# Patient Record
Sex: Female | Born: 1935 | Race: White | Hispanic: No
Health system: Southern US, Community
[De-identification: ages and names within clinical notes are randomized; demographics above are authoritative.]

## PROBLEM LIST (undated history)

## (undated) DIAGNOSIS — I10 Essential (primary) hypertension: Secondary | ICD-10-CM

## (undated) DIAGNOSIS — I2510 Atherosclerotic heart disease of native coronary artery without angina pectoris: Secondary | ICD-10-CM

## (undated) HISTORY — PX: ABDOMINAL HYSTERECTOMY: SHX81

---

## 2006-06-24 ENCOUNTER — Ambulatory Visit: Admission: RE | Admit: 2006-06-24 | Discharge: 2006-06-24 | Payer: Self-pay | Admitting: Gynecologic Oncology

## 2006-07-29 ENCOUNTER — Encounter (INDEPENDENT_AMBULATORY_CARE_PROVIDER_SITE_OTHER): Payer: Self-pay | Admitting: *Deleted

## 2006-07-29 ENCOUNTER — Inpatient Hospital Stay (HOSPITAL_COMMUNITY): Admission: RE | Admit: 2006-07-29 | Discharge: 2006-08-03 | Payer: Self-pay | Admitting: Gynecologic Oncology

## 2006-08-11 ENCOUNTER — Ambulatory Visit: Admission: RE | Admit: 2006-08-11 | Discharge: 2006-08-11 | Payer: Self-pay | Admitting: Gynecologic Oncology

## 2007-04-13 ENCOUNTER — Ambulatory Visit: Admission: RE | Admit: 2007-04-13 | Discharge: 2007-04-13 | Payer: Self-pay | Admitting: Gynecologic Oncology

## 2007-04-13 ENCOUNTER — Encounter: Payer: Self-pay | Admitting: Gynecologic Oncology

## 2007-04-13 ENCOUNTER — Other Ambulatory Visit: Admission: RE | Admit: 2007-04-13 | Discharge: 2007-04-13 | Payer: Self-pay | Admitting: Gynecologic Oncology

## 2007-12-22 ENCOUNTER — Ambulatory Visit: Admission: RE | Admit: 2007-12-22 | Discharge: 2007-12-22 | Payer: Self-pay | Admitting: Gynecologic Oncology

## 2007-12-22 ENCOUNTER — Encounter: Payer: Self-pay | Admitting: Gynecologic Oncology

## 2007-12-22 ENCOUNTER — Other Ambulatory Visit: Admission: RE | Admit: 2007-12-22 | Discharge: 2007-12-22 | Payer: Self-pay | Admitting: Gynecologic Oncology

## 2008-08-22 ENCOUNTER — Ambulatory Visit: Admission: RE | Admit: 2008-08-22 | Discharge: 2008-08-22 | Payer: Self-pay | Admitting: Gynecologic Oncology

## 2008-08-22 ENCOUNTER — Other Ambulatory Visit: Admission: RE | Admit: 2008-08-22 | Discharge: 2008-08-22 | Payer: Self-pay | Admitting: Gynecologic Oncology

## 2008-08-22 ENCOUNTER — Encounter: Payer: Self-pay | Admitting: Gynecologic Oncology

## 2009-03-07 ENCOUNTER — Ambulatory Visit: Admission: RE | Admit: 2009-03-07 | Discharge: 2009-03-07 | Payer: Self-pay | Admitting: Gynecologic Oncology

## 2009-03-07 ENCOUNTER — Other Ambulatory Visit: Admission: RE | Admit: 2009-03-07 | Discharge: 2009-03-07 | Payer: Self-pay | Admitting: Gynecologic Oncology

## 2009-03-07 ENCOUNTER — Encounter: Payer: Self-pay | Admitting: Gynecologic Oncology

## 2009-07-25 ENCOUNTER — Encounter: Payer: Self-pay | Admitting: Gynecologic Oncology

## 2009-07-25 ENCOUNTER — Ambulatory Visit: Admission: RE | Admit: 2009-07-25 | Discharge: 2009-07-25 | Payer: Self-pay | Admitting: Gynecologic Oncology

## 2009-07-25 ENCOUNTER — Other Ambulatory Visit: Admission: RE | Admit: 2009-07-25 | Discharge: 2009-07-25 | Payer: Self-pay | Admitting: Gynecologic Oncology

## 2010-02-06 ENCOUNTER — Ambulatory Visit: Admission: RE | Admit: 2010-02-06 | Discharge: 2010-02-06 | Payer: Self-pay | Admitting: Gynecologic Oncology

## 2010-02-06 ENCOUNTER — Other Ambulatory Visit: Admission: RE | Admit: 2010-02-06 | Discharge: 2010-02-06 | Payer: Self-pay | Admitting: Gynecologic Oncology

## 2010-07-24 ENCOUNTER — Other Ambulatory Visit: Admission: RE | Admit: 2010-07-24 | Discharge: 2010-07-24 | Payer: Self-pay | Admitting: Gynecologic Oncology

## 2010-07-24 ENCOUNTER — Ambulatory Visit: Admission: RE | Admit: 2010-07-24 | Discharge: 2010-07-24 | Payer: Self-pay | Admitting: Gynecologic Oncology

## 2011-01-15 ENCOUNTER — Ambulatory Visit: Payer: Medicare Other | Attending: Gynecologic Oncology | Admitting: Gynecologic Oncology

## 2011-01-15 DIAGNOSIS — Z90710 Acquired absence of both cervix and uterus: Secondary | ICD-10-CM | POA: Insufficient documentation

## 2011-01-15 DIAGNOSIS — Z9079 Acquired absence of other genital organ(s): Secondary | ICD-10-CM | POA: Insufficient documentation

## 2011-01-15 DIAGNOSIS — C549 Malignant neoplasm of corpus uteri, unspecified: Secondary | ICD-10-CM | POA: Insufficient documentation

## 2011-01-31 NOTE — Consult Note (Signed)
Evelyn Patterson, Evelyn Patterson                 ACCOUNT NO.:  [id number]  MEDICAL RECORD NO.:  [id number]           PATIENT TYPE:  LOCATION:                                 FACILITY:  PHYSICIAN:  Haedyn Breau A. Duard Brady, MD    DATE OF BIRTH:  1929-01-01  DATE OF CONSULTATION:  01/15/2011 DATE OF DISCHARGE:                                CONSULTATION   HISTORY:  [name] is a very pleasant 77 year old, soon to be 77 year old with a stage IB grade 1 endometrial carcinoma who underwent TAH-BSO, bilateral pelvic lymph node sampling in September 2007.  Final pathology revealed a grade 1 lesion with 20% myometrial invasion, negative lymph nodes, negative adnexa, and no lymphovascular space involvement.  She was dispositioned to close followup and then was last seen by me in September of last year, at which time her exam and Pap smear were negative.  She comes in today for followup.  REVIEW OF SYSTEMS:  She denies any significant complaints.  She denies any nausea, vomiting, fever, chills, headaches, visual changes.  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 She is out of her diclofenac, which causes her to have some discomfort. She states that when she is not on her diclofenac, she has a lot of arthritic-type pains and finds it difficult to get up and around, but when she takes it she feels quite well and she needs a prescription for that.  She has not had a recurrent UTIs.  She denies any unintentional weight loss, weight gain.  She enjoyed 2 weeks in [place] with her son over the Thanksgiving holiday and is very much looking forward to going back next year.  MEDICATION LIST:  Reviewed and is unchanged.  HEALTH MAINTENANCE:  She is up to date on her mammogram, she had one in December.  PHYSICAL EXAMINATION:  VITAL SIGNS:  Height 4 feet 11 inches, weight 145 pounds, blood pressure 128/80, pulse 66, temperature 97.3, respirations 20. GENERAL:  A well-nourished,  well-developed female, in no acute distress. NECK:  Supple.  There is no lymphadenopathy, no thyromegaly. LUNGS:  Clear to auscultation bilaterally. CARDIOVASCULAR:  Regular rate and rhythm. ABDOMEN:  A well-healed vertical midline incision.  Abdomen is soft, nontender, nondistended with no palpable masses or hepatosplenomegaly. Groins are negative for adenopathy. EXTREMITIES:  No edema. PELVIC:  External genitalia is within normal limits, though atrophic. The vagina is markedly atrophic.  The vaginal cuff is visualized.  There are no visible lesions.  Bimanual examination reveals no masses or nodularity. RECTAL: Confirms.  ASSESSMENT:  A 77 year old, soon to be 77 year old with an early stage endometrial carcinoma who clinically has no evidence of recurrent disease.  PLAN: 1. She will return to see [place] in 6 months and that will be her 5-year     anniversary.  We will perform a Pap smear at that time.  As it will     be 5 years at that time, we will find her a practice closer to home     for her routine     gynecologic care.  Her first  referring OB-GYN is no longer     practice. 2. She will follow up with Dr. Desmond Dike, her new primary care     physician. 3. I refill her diclofenac.     Evelyn Patterson A. Duard Brady, MD     PAG/MEDQ  D:  01/15/2011  T:  01/16/2011  Job:  [id number]  cc:   Telford Nab, R.N. [address] [place], [place] [address]  Dr. Desmond Dike  Electronically Signed by Cleda Mccreedy MD on 01/16/2011 12:11:30 PM

## 2011-03-25 NOTE — Consult Note (Signed)
NAME:  TAIANA, TEMKIN                 ACCOUNT NO.:  [id number]   MEDICAL RECORD NO.:  [id number]          PATIENT TYPE:  OUT   LOCATION:  GYN                          FACILITY:  [facility]   PHYSICIAN:  Paola A. Duard Brady, MD    DATE OF BIRTH:  [birth year]   DATE OF CONSULTATION:  12/22/2007  DATE OF DISCHARGE:                                 CONSULTATION   Ms. Ream is a 77 year old with a history of a stage IB grade 1  endometrial carcinoma who underwent TAH-BSO, bilateral pelvic lymph node  dissection September 2007.  Operative findings included a normal-  appearing uterus with normal atrophic adnexa.  Frozen section revealed a  grade 1 endometrioid adenocarcinoma with 20% myometrial invasion,  negative lymph nodes, negative washings and negative adnexa.  I last saw  her in June 2008.  At that time, her exam was unremarkable.  Pap smear  was negative.  She states that she was seen by Dr. Oliva Bustard in  October.  I do not have records of this and per her recollection her  exam was also negative.  She comes in today for follow-up.   REVIEW OF SYSTEMS:  She denies any chest pain, shortness of breath,  nausea, vomiting, fevers, chills, headaches or visual change.  She  denies any significant change in bowel or bladder habits, any vaginal  bleeding, chest pain, shortness of breath, unintentional weight loss or  weight gain. Her arthritis pain is much better, her arthritis is  primarily in her right shoulder and left hip.  She was started on  arthritis medications by her primary physician which she takes twice a  day.  She does not recall the name of it but with that her pain is  significantly improved.  She exercises twice daily twice a day and raked  her leaves yesterday.   MEDICATIONS:  Arthritis medicine, questionable name, simvastatin,  triamterene, hydrochlorothiazide, multivitamin, vitamin C, calcium.   ALLERGIES:  None.   HEALTH MAINTENANCE:  She is up-to-date on her mammograms.   PHYSICAL EXAMINATION:  Weight 145 pounds which is up 13 pounds since her  last visit, blood pressure 128/78, well-nourished, well-developed female  in no acute distress.  NECK:  Supple, there is no lymphadenopathy, no thyromegaly.  LUNGS:  Clear to auscultation bilaterally.  CARDIOVASCULAR:  Regular rate and rhythm.  ABDOMEN:  She has a well-healed vertical skin incision. There is no  evidence of about incisional hernias.  The abdomen is soft, nontender,  nondistended.  There are no palpable masses or hepatosplenomegaly.  Groins are negative for adenopathy.  EXTREMITIES:  There is no edema.  PELVIC:  External genitalia is within normal limits though atrophic. The  vagina is markedly atrophic.  The vaginal cuff is visualized.  There are  no visible lesions.  A ThinPrep Pap was submitted without difficulty.  Bimanual examination reveals no masses or nodularity.  Rectal confirms.   ASSESSMENT:  A 77 year old with a stage IB grade 1 endometrioid  adenocarcinoma who has no evidence of recurrent disease.  She is out  approximately a year and half from  her surgery.   PLAN:  Will followup on the results of her Pap smear from today.  She  will see Dr. Thamas Jaegers in 4 months and return to see [place] in 8 months.      Paola A. Duard Brady, MD  Electronically Signed     PAG/MEDQ  D:  12/22/2007  T:  12/23/2007  Job:  [id number]   cc:   Oliva Bustard  Fax: [phone]   Telford Nab, R.N.  [address]  [place], [place] [address]   Wyonia Hough, MD   Roseanna Rainbow, M.D.  Fax: [phone]

## 2011-03-25 NOTE — Consult Note (Signed)
NAME:  Evelyn Patterson, Evelyn Patterson                 ACCOUNT NO.:  [id number]   MEDICAL RECORD NO.:  [id number]          PATIENT TYPE:  OUT   LOCATION:  GYN                          FACILITY:  [facility]   PHYSICIAN:  Paola A. Duard Brady, MD    DATE OF BIRTH:  October 13, 1929   DATE OF CONSULTATION:  04/13/2007  DATE OF DISCHARGE:                                 CONSULTATION   Ms. Haan is a 77 year old with a history of a stage IB grade I  endometrial carcinoma who underwent TAH-BSO, partial pelvic lymph node  dissection in September 2007.  Operative findings included a normal-  appearing uterus with normal atrophic adnexa.  We sent the uterus for  frozen section and while frozen section was pending, bilateral pelvic  lymph node sampling was obtained.  The tumor invaded 20% of the  myometrium with no lymphovascular space involvement, negative washings,  negative adnexa and negative lymph nodes.  We last saw her in October  2007.  She was seen by Dr. Oliva Bustard in the interim in January 2008,  but no Pap smear was performed at that time.  She comes in today for  follow-up.  She is overall doing quite well.  She is very happy with how  she has been feeling.   REVIEW OF SYSTEMS:  She denies any chest pain, shortness of breath,  nausea, vomiting, fevers, chills, headaches or visual changes.  She has  lost a fair amount of weight but that has been intentional.  She has  been out in her yard quite a bit.  She has also been to see her primary  care physician and they gave her some medication for arthritis pains  which has improved her quality of life significantly.  She does not  recall the name of the medication she has taken.  She was seen by her  primary physician last week and had blood work done and states that  everything was excellent.  She otherwise denies any complaints.   MEDICATIONS:  Simvastatin and triamterene/hydrochlorothiazide.   ALLERGIES:  NONE.   PHYSICAL EXAMINATION:  GENERAL APPEARANCE:  Weight  132 pounds, well-  nourished, alert female in no acute distress.  NECK:  Supple.  There is no lymphadenopathy, no thyromegaly.  LUNGS:  Clear to auscultation bilaterally.  CARDIOVASCULAR:  Regular rate and rhythm.  ABDOMEN:  She has a well-healed vertical skin incision.  Abdomen is  soft, nontender, nondistended.  No palpable masses or  hepatosplenomegaly.  Groins are negative for adenopathy.  EXTREMITIES:  There is no edema.  PELVIC:  External genitalia is within normal limits.  Vagina is slightly  atrophic.  The vaginal cuff is visualized.  There are no visible  lesions.  ThinPrep Pap was submitted without difficulty.  Bimanual  examination reveals no masses or nodularity.  Rectal confirms.   ASSESSMENT:  A 77 year old with a stage IB grade I endometrial carcinoma  who has no evidence of recurrent disease.   PLAN:  1. Will follow up the results of her Pap smear from today.  2. She will see Dr. Thamas Jaegers for a pelvic  examination and Pap smear in      four months and will return to see [place] in eight months.      Paola A. Duard Brady, MD  Electronically Signed     PAG/MEDQ  D:  04/13/2007  T:  04/13/2007  Job:  [id number]   cc:   Oliva Bustard  Fax: [phone]   Telford Nab, R.N.  [address]  [place], [place] [address]   Wyonia Hough, M.D.   Roseanna Rainbow, M.D.  Fax: [phone]

## 2011-03-25 NOTE — Consult Note (Signed)
NAME:  Evelyn Patterson, Evelyn Patterson                 ACCOUNT NO.:  [id number]   MEDICAL RECORD NO.:  [id number]          PATIENT TYPE:  OUT   LOCATION:  GYN                          FACILITY:  [facility]   PHYSICIAN:  Paola A. Duard Brady, MD    DATE OF BIRTH:  01-08-1929   DATE OF CONSULTATION:  08/22/2008  DATE OF DISCHARGE:                                 CONSULTATION   HISTORY OF PRESENT ILLNESS:  Evelyn Patterson is a very pleasant 77 year old with  history of stage I B grade 1 endometrial carcinoma who underwent TAH-  BSO, bilateral pelvic lymph node sampling September 2007.  Operative  findings included normal anatomy.  Frozen section revealed a grade 1  lesion 20% myometrial invasion.  Final pathology ruled 20% myometrial  invasion, negative lymph nodes, negative washings, negative adnexa and  no lymphovascular space involvement.  She was dispositioned to close  follow-up.  I last saw her in February 2009, at which time her exam and  Pap smear were negative.  She comes in today for follow-up.  She is  overall doing quite well.  She recently fell and broke the wrist on her  right hand.  She is getting her cast off this coming Friday, which she  is very happy about.  She states that she has been much more sedentary  since that happened, she is not able to get her usual activities done,  it takes a longer to do things in the garden but she is looking forward  to getting back into shape.  She, otherwise, denies any complaints, she  denies any vaginal bleeding, any change in bowel or bladder habits, no  nausea, vomiting, fevers, chills, headaches, visual changes.  She denies  any abdominal pain and change in her bowel or bladder habits,  unintentional weight loss or weight gain.  She states she was seen by  Dr. Thamas Jaegers 4 months ago with a negative exam and negative Pap smear.  We  do not have any results for today.   PHYSICAL EXAMINATION:  VITAL SIGNS:  Weight 146 pounds, blood pressure  126/75.  GENERAL:   Well-nourished, well-developed female in no acute distress.  NECK:  Supple with no lymphadenopathy, no thyromegaly.  LUNGS:  Clear to auscultation bilaterally.  CARDIOVASCULAR:  Regular rate and rhythm.  ABDOMEN:  Shows well-healed midline vertical incision. There is no  evidence of an incisional hernia.  ABDOMEN:  Soft, nontender, nondistended with no palpable mass or  hepatosplenomegaly.  Groins are negative for adenopathy.  EXTREMITIES:  There is no edema.  She has a cast on her right arm.  PELVIC:  External genitalia is within normal limits, though atrophic.  The vagina is markedly atrophic.  Vaginal cuff is visualized.  No  visible lesions.  ThinPrep Pap was submitted without difficulty.  Bimanual examination reveals no masses or nodularity.  Rectal confirms.   ASSESSMENT:  A 77 year old with a stage I B grade 1 endometrial  carcinoma diagnosed and treated 2 years ago, has no evidence of  recurrent disease.   PLAN:  Follow up results for Pap smear from today.  She will return to  see [place] in 6 months.      Paola A. Duard Brady, MD  Electronically Signed     PAG/MEDQ  D:  08/22/2008  T:  08/22/2008  Job:  [id number]   cc:   Oliva Bustard  Fax: [phone]   Telford Nab, R.N.  [address]  [place], [place] [address]   Loma Boston, M.D.  Fax: [phone]

## 2011-03-25 NOTE — Consult Note (Signed)
NAME:  Evelyn Patterson, SHADRICK                 ACCOUNT NO.:  [id number]   MEDICAL RECORD NO.:  [id number]          PATIENT TYPE:  OUT   LOCATION:  GYN                          FACILITY:  [facility]   PHYSICIAN:  Paola A. Duard Brady, MD    DATE OF BIRTH:  10/03/1929   DATE OF CONSULTATION:  03/07/2009  DATE OF DISCHARGE:                                 CONSULTATION   Evelyn Patterson is a very pleasant 77 year old with a history of stage IB grade  1 endometrial carcinoma, who underwent TAH-BSO bilateral pelvic lymph  node sampling in September 2007.  Frozen section revealed a grade 1  lesion with 20% myometrial invasion.  It was confirmed on final  pathology that she had 20% myometrial invasion, negative lymph node  washings, negative adnexa, and no lymphovascular space involvement.  She  was dispositioned to close follow-up.  I last saw her in October 2009,  at which time her exam was unremarkable as was her Pap smear -- which  showed atrophic vaginitis.  She comes in today for followup.   She is overall doing quite well and really denies any significant  complaint.  She does complain of feeling tired, but she states that even  though she gives out she does not give up.  She recently planted 75  plants in her garden and she states it takes her longer than it did 50  years ago, but she is able to manage just fine.  She states she is  otherwise feeling quite well and is okay.  She is requesting  prescriptions for the shingles shot, as her daughter-in-law recently  had shingles and she would like to avoid getting that, as she saw how  much pain her daughter-in-law was in.   REVIEW OF SYSTEMS:  She does complain of feeling tired.  She otherwise  feels okay.  She denies any bleeding, any change in bowel or bladder  habits; any nausea, vomiting, fevers, chills, chest pain, short of  breath, cough, headaches, visual changes, unintentional weight loss or  weight gain.   Medication list is reviewed and is  unchanged.   HEALTH MANAGEMENT:  She is up-to-date on her mammograms, with her next  one due in June 2010.   PHYSICAL EXAMINATION:  Weight 146 pounds, which is stable.  Blood  pressure 146/74, pulse 64.  A well-nourished, well-developed female in  no acute distress.  NECK:  Supple.  There is no lymphadenopathy and no thyromegaly.  LUNGS:  Clear to auscultation bilaterally.  CARDIOVASCULAR:  Regular rate and rhythm.  ABDOMEN:  Shows a well-healed vertical midline incision.  There was no  evidence of any incisional hernias.  Abdomen is soft, nontender,  nondistended.  There are no palpable masses or hepatosplenomegaly.  Groins are negative for adenopathy.  EXTREMITIES:  There was no edema.  PELVIC:  External genitalia is within normal limits, though atrophic.  The vagina is markedly atrophic as is the vaginal cuff.  ThinPrep Pap  was submitted without difficulty.  Bimanual examination reveals no  masses or nodularity.  Rectal confirms.   ASSESSMENT:  A 77 year old  with a stage IB grade 1 endometrial  carcinoma, diagnosed and treated 2-12 years ago; who has no evidence of  recurrent disease.   PLAN:  Will follow up on the results for Pap smear from today.  She will  return to see [place] in 6 months.      Paola A. Duard Brady, MD  Electronically Signed     PAG/MEDQ  D:  03/07/2009  T:  03/07/2009  Job:  [id number]   cc:   Oliva Bustard  Fax: [phone]   Telford Nab, R.N.  [address]  [place], [place] [address]   Dr. Loma Boston, M.D.  Fax: [phone]

## 2011-03-28 NOTE — Op Note (Signed)
NAME:  Evelyn Patterson, Evelyn Patterson                 ACCOUNT NO.:  [id number]   MEDICAL RECORD NO.:  [id number]          PATIENT TYPE:  INP   LOCATION:  9320                          FACILITY:  [facility]   PHYSICIAN:  Paola A. Duard Brady, MD    DATE OF BIRTH:  04/19/1929   DATE OF PROCEDURE:  07/29/2006  DATE OF DISCHARGE:                                 OPERATIVE REPORT   PREOPERATIVE DIAGNOSIS:  Twenty-one-millimeter endometrial stripe with a  complex solid lesion measuring 2.6 x 2.4 x 1.6 cm.  She has undergone an  unsuccessful hysteroscopy, dilatation and curettage.  She did undergo an  attempted endometrial biopsy, which was insufficient.   POSTOPERATIVE DIAGNOSIS:  Grade 1 minimally invasive endometrial carcinoma.   PROCEDURE:  1. Exploratory laparotomy.  2. Total abdominal hysterectomy.  3. Bilateral salpingo-oophorectomy.  4. Bilateral pelvic lymph node dissection.   SURGEONS:  1. Paola A. Duard Brady, MD  2. Roseanna Rainbow, M.D.   ASSISTANT:  Telford Nab, R.N.   ANESTHESIA:  General.   ESTIMATED BLOOD LOSS:  25 mL.   INTRAVENOUS FLUIDS AND URINE OUTPUT:  Please refer to anesthesia record.   COMPLICATIONS:  None.   SPECIMENS:  To Pathology.   OPERATIVE FINDINGS:  Included a normal-appearing uterus, cervix, tubes and  ovaries.  Gross inspection of the uterus revealed a 2-cm polypoid lesion.  Pathology was consistent with a grade 1 endometrioid adenocarcinoma with  minimal myometrial invasion.  There was no gross adenopathy.  She had  physiologic adhesions of the rectosigmoid colon to the left adnexa.   The patient was taken to the operating room and placed in supine position,  where general anesthesia was induced.  A time-out procedure was then  performed to confirm the patient, procedure and allergy status.  Antibiotics  were given preoperatively.  The abdomen was prepped in the usual sterile  fashion and the was prepped in the usual sterile fashion.  A Foley catheter  was  inserted into the bladder under sterile conditions.  The abdomen was  then draped.  A vertical infraumbilical midline incision was made with a  knife and carried down to the underlying fascia using Bovie cautery.  The  fascial incision was scored in the midline and the fascial incision was  extended superiorly and inferiorly using Bovie cautery.  The rectus bellies  were dissected off the overlying fascia.  The peritoneum was identified,  tented and entered sharply.  The perineal incision was extended superiorly  and inferiorly with visualization of the underlying peritoneal cavity.  Abdominal and pelvic washings were then obtained and sent to Pathology.  The  Bookwalter self-retaining retractor was placed on the bed and the Bookwalter  was placed onto the patient with all appropriate precautions with the  smallest blades possible used.  At this point, at several points throughout  the case, we continued checking the Bookwalter blade position to ensure that  there was no pressure on the psoas bellies.  The cornua of the uterus were  grasped bilaterally with curved Kelly clamps.  Our attention was first drawn  to the round ligament  on the patient's right side.  The round ligament was  cauterized with Bovie cautery.  The anterior and posterior leaves of the  broad ligament were then opened.  The ureter was identified on the patient's  right side; a window was made between the IP and the ureter.  The IP was  clamped x2, transected and ligated with 0 Vicryl.  We then continued our  dissection anteriorly.  The bladder flap was created anteriorly.  The  uterine artery was skeletonized on the patient's right side and clamped with  curved Mastersons and was transected and suture-ligated with 0 Vicryl.  Our  attention was then drawn to the patient's left side and a similar procedure  was performed.  There were some filmy physiologic adhesions of the  rectosigmoid colon to the left adnexa; these were  taken down using Bovie  cautery.  In a similar fashion, the round ligament was transected.  The  anterior and posterior leaves of the broad ligament were then opened and the  ureter was identified.  The blood supply to the ovary was clamped x2,  transected and ligated.  The uterine artery was skeletonized on her left  side and a bladder flap was created to a point below the level of the  cervix.  After the uterine artery on the patient's left side was clamped, it  was transected and suture-ligated with a 0 Vicryl stitch.  We continued down  the cardinal ligament, clamping and suturing with 0 Vicryl.  We then reached  the cervicovaginal angles.  Curved Mastersons were placed across the base of  the cervix.  The specimen was handed off to Pathology for frozen section.  The vaginal cuff was closed using figure-of-eight sutures of 0 Vicryl.  At  this point, our attention was drawn to the patient's left sidewall.  The  pararectal space on her left side in the paravaginal space was opened.  The  nodal bundle extending over the external iliac artery and vein was taken  down using sharp dissection and pinpoint cautery when indicated.  The ureter  was noted to be well medial of the area of dissection.  Our attention was  then drawn to the obturator nerve bundle.  A vein retractor was placed on  the external iliac vein and elevated.  The obturator nerve was identified  and the nodal bundle superior to the obturator nerve was taken down using  sharp dissection with cautery as indicated.  The area was noted to be  hemostatic.  All vascular pedicles were noted to be hemostatic and all  vascular structures were intact, as were the nerves.  The ureter was noted  to be well medial of the area of dissection, was peristalsing and  nondilating.  A similar procedure was done on the patient's right side.  The  abdomen and pelvis were copiously irrigated.  At this point, frozen section returned as minimal  invasion of a grade 1 disease and decision was made not  to proceed with para-aortic lymph node dissection, as this would require  extension of the incision and added morbidity to the patient with little  benefit.  The fascia was closed with a #1 PDS.  At this point, our count of  laps was incorrect.  Fourteen laps were in the room, when only 10 were used  for the case.  X-rays were then used to confirm that there were no  laparotomy sponges in the patient.  The skin was irrigated, pinpoint cautery  was  used and the skin was closed using  skin clips.  The patient tolerated the procedure well and was taken to the  recovery room in stable condition and was extubated without difficulty.  All  instrument and needle counts were correct.  Ray-Tec counts are as listed  above, but none were in the abdomen.      Paola A. Duard Brady, MD  Electronically Signed     PAG/MEDQ  D:  07/29/2006  T:  07/31/2006  Job:  [id number]   cc:   Oliva Bustard  Fax: [phone]   Telford Nab, R.N.  [address]  [place], [place] [address]   Wyonia Hough MD   Roseanna Rainbow, M.D.  Fax: [phone]

## 2011-03-28 NOTE — Consult Note (Signed)
Evelyn Patterson, Evelyn Patterson                 ACCOUNT NO.:  [id number]   MEDICAL RECORD NO.:  [id number]          PATIENT TYPE:  OUT   LOCATION:  GYN                          FACILITY:  [facility]   PHYSICIAN:  Paola A. Duard Brady, MD    DATE OF BIRTH:  10-Oct-1929   DATE OF CONSULTATION:  06/24/2006  DATE OF DISCHARGE:                                   CONSULTATION   Patient seen today in consultation at the request of Dr. Thamas Jaegers.  Ms. Earll is  a 77 year old gravida 2, para 2 who has been menopausal for approximately 20-  30 years.  She states that a month ago she had an episode of vaginal  bleeding.  She was seen by Dr. Thamas Jaegers and underwent a vaginal ultrasound.  The uterus was 6.6 x 4.4 x 3.5 cm.  There was a 21-mm endometrial stripe  with a complex solid lesion measuring 2.6 x 2.4 x 1.6 cm.  The ovaries and  the adnexa were not noted.  There was no free fluid.  She underwent an  unsuccessful hysteroscopy D&C secondary to cervical stenosis on May 29, 2006 and subsequently referred to [place] today.  She is overall doing quite well  and denies any significant complaints.  She has not had any further  bleeding.  She denies any pain, any significant change in her bowel or  bladder habits.   FULL 10-POINT REVIEW OF SYSTEMS:  She denies any chest pain, shortness of  breath, nausea, vomiting, fevers, chills, headaches, visual changes.  She  denies any change in her bowel or bladder habits, any early satiety,  headaches, weight loss, weight gain, fevers, or chills.   PAST MEDICAL HISTORY:  1. Hypercholesterolemia.  2. Hypertension.   PAST MEDICAL HISTORY:  None.   MEDICATIONS:  1. Simvastatin 20 mg daily.  2. Triamterene/hydrochlorothiazide 50/75 daily.   SOCIAL HISTORY:  She denies tobacco or alcohol.   ALLERGIES:  None.   FAMILY HISTORY:  Her father had lung cancer.  She has two sons who had  hypertension, one had leukemia. Her mother has hypertension and strokes.   HEALTH MAINTENANCE:  She is  up-to-date on her mammograms.  She had a  negative Pap smear by Dr. Wyonia Hough.   PHYSICAL EXAMINATION:  VITAL SIGNS:  Weight 152 pounds, height 5 feet, blood  pressure 144/82, pulse 80.  GENERAL:  Well-nourished, well-developed female in no acute distress.  ABDOMEN:  Soft and nontender.  PELVIC:  External genitalia is within normal limits.  The vagina is  atrophic.  After obtaining her verbal consent, Hurricaine gel was placed on  the cervix.  Single tooth tenaculum was placed on the anterior lip of the  cervix.  There were no gross visible lesions.  Cervix appeared nulliparous.  Os finder was used and without difficulty.  Patient had some discomfort, but  otherwise tolerated procedure well.  Endometrial biopsy Pipelle was then  passed through the cervical os into the uterus.  The uterus sounded to 6-1/2-  7 cm.  It does appear that there is a mass that required manipulation with  the biopsy Pipelle to navigate.  Two passes were obtained with some tissue.  There was minimal blood.  Tenaculum was removed from the cervix.  There was  no bleeding from the tenaculum sites.  Bimanual examination:  The corpus is  of normal size, shape, and consistency.  There are no adnexal masses.   ASSESSMENT:  A 77 year old with cervical stenosis and a uterine mass which  could be a benign or malignant polyp.   PLAN:  Will follow up results of her biopsy from today.  She would like [place]  to call her daughter-in-law, Branden Vine.  Her cell phone number is [phone]  [phone] or at her home number [phone] with the results.  The patient has  given her permission and consent for [place] to speak to her daughter-in-law  regarding this.  If the biopsy should be nondiagnostic she understands she  may require a hysterectomy for diagnosis.  If the biopsy reveals malignant  disease, we will need to proceed with surgical intervention.  Their  questions were elicited and answered to their satisfaction.  They were given  my  card.      Paola A. Duard Brady, MD  Electronically Signed     PAG/MEDQ  D:  06/24/2006  T:  06/24/2006  Job:  [id number]   cc:   Thamas Jaegers, M.D.   Telford Nab, R.N.  [address]  [place], [place] [address]   Wyonia Hough, M.D.

## 2011-03-28 NOTE — H&P (Signed)
NAME:  Evelyn Patterson, Evelyn Patterson                 ACCOUNT NO.:  [id number]   MEDICAL RECORD NO.:  [id number]          PATIENT TYPE:  OUT   LOCATION:  GYN                          FACILITY:  [facility]   PHYSICIAN:  Paola A. Duard Brady, MD    DATE OF BIRTH:  04/28/1929   DATE OF ADMISSION:  08/11/2006  DATE OF DISCHARGE:                                HISTORY & PHYSICAL   HISTORY OF PRESENT ILLNESS:  Evelyn Patterson is a very pleasant 77 year old whom we  initially saw as a referral from Dr. Thamas Jaegers and for thickened endometrial  stripe.  He had attempted appropriate endometrial biopsies and hysteroscopy,  which were not successful secondary to cervical stenosis.  When we initially  saw the patient on June 24, 2006, attempt was made to proceed with an  endometrial biopsy.  We did get within the endometrium but the sample was  insufficient, secondary to CT scan cellularity.  Secondary to the patient  having a complex solid lesion measuring 2.6 x 2.4 x 1.6 cm within the  uterus, the decision was made to proceed with hysterectomy.  Therefore, on  July 29, 2006 the patient underwent exploratory laparotomy, TAH-BSO and  pelvic lymph node dissection.  Operative findings included a normal-  appearing uterus with normal atrophic adnexa.  Frozen section was consistent  with a grade 1 endometrial carcinoma with less than 50% myometrial invasion.  While frozen section was pending, bilateral pelvic lymph node sampling was  obtained.  The washings were negative.  Tumor invaded 20% into the  myometrium with 0.3 cm with a myometrial thickness measuring 1.5 cm.  There  was no lymphovascular space invasion; 0/7 lymph nodes were involved, the  washings were negative and the adnexa were negative.  The patient comes in  today for a postoperative check.  She is overall doing quite well.  She has  been informed of the pathology reports prior to now, is very pleased with  the report.  She did have some initial issues with constipation  which has  subsequently resolved itself and she is having regular bowel movements.  She  states that prior to the surgery she was having to wake up several times a  night to void and those symptoms have improved, which has improved her  quality of life.   PHYSICAL EXAMINATION:  VITAL SIGNS:  Weight is 148 pounds, which is down 4  pounds from June 24, 2006.  GENERAL:  Well-nourished, well-developed elderly female in no acute  distress.  ABDOMEN:  Shows well-healed surgical incision.  Abdomen is soft, nontender,  nondistended with no palpable masses or hepatosplenomegaly.  Groins are  negative for adenopathy.  EXTREMITIES:  Show no edema.  PELVIC:  External genitalia is markedly atrophic.  The vagina is atrophic.  The vaginal cuff is visualized, it is healing well.  Bimanual examination  reveals no cuff tenderness or nodularity or masses.   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   PLAN:  I believe that we can  alternate her visits between Dr. Thamas Jaegers and our  office.  The patient does not require any adjuvant therapy for her early  grade, early stage endometrial carcinoma.   PLAN:  She will follow up with Dr. Oliva Bustard in 4 months for a visit and  will alternate every 4 months the first 2 years.  After 2 years of visits,  she will alternate every 6 months for an additional 3 years, and will be  released after 5 years of care.  Her questions were elicited and answered to  her satisfaction.      Paola A. Duard Brady, MD  Electronically Signed     PAG/MEDQ  D:  08/11/2006  T:  08/12/2006  Job:  [id number]   cc:   Oliva Bustard  Fax: [phone]   Telford Nab, R.N.  [address]  [place], [place] [address]   Loma Boston, M.D.  Fax: [phone]

## 2011-03-28 NOTE — Discharge Summary (Signed)
Evelyn Patterson, Evelyn Patterson                 ACCOUNT NO.:  [id number]   MEDICAL RECORD NO.:  [id number]          PATIENT TYPE:  INP   LOCATION:  9320                          FACILITY:  [facility]   PHYSICIAN:  Roseanna Rainbow, M.D.DATE OF BIRTH:  1929-03-28   DATE OF ADMISSION:  07/29/2006  DATE OF DISCHARGE:  08/03/2006                                 DISCHARGE SUMMARY   CHIEF COMPLAINT:  The patient is a 77 year old with a uterine mass, who  presents for total abdominal hysterectomy, bilateral salpingo-oophorectomy  with possible staging.  Please see the dictated history and physical as per  Dr. Cleda Mccreedy.   HOSPITAL COURSE:  The patient was admitted and underwent a total abdominal  hysterectomy, bilateral salpingo-oophorectomy, and bilateral pelvic  lymphadenectomies.  Please see the operative summary.   On postoperative day #1, she had borderline hypokalemia with a potassium of  3.3.  Her hemoglobin was 14.1.  The potassium was repleted.   On postoperative day #2, her basic metabolic profile was normal.  Her diet  was slowly advanced.   On postoperative day #4, she reported a bowel movement, and her diet was  advanced to a regular diet.  She was subsequently discharged to home on  postoperative day #5.   DISCHARGE DIAGNOSIS:  Endometrioid adenocarcinoma, stage IB.   PROCEDURE:  Total abdominal hysterectomy, bilateral salpingo-oophorectomy,  bilateral pelvic lymphadenectomy.   CONDITION:  Stable.   DIET:  Regular.   ACTIVITY:  Progressive activity.  Pelvic rest.   MEDICATIONS:  Included Percocet.  Resume home medications.   DISPOSITION:  Patient was to return to GYN oncology for staple removal.      Roseanna Rainbow, M.D.  Electronically Signed     LAJ/MEDQ  D:  08/25/2006  T:  08/26/2006  Job:  [id number]   cc:   Telford Nab, R.N.  [address]  [place], [place] [address]   Dr. Thamas Jaegers   Dr. Wyonia Hough

## 2011-07-30 ENCOUNTER — Other Ambulatory Visit (HOSPITAL_COMMUNITY)
Admission: RE | Admit: 2011-07-30 | Discharge: 2011-07-30 | Disposition: A | Discharge status: Home / Self Care | Payer: Medicare Other | Source: Ambulatory Visit | Attending: Gynecologic Oncology | Admitting: Gynecologic Oncology

## 2011-07-30 ENCOUNTER — Ambulatory Visit: Payer: PRIVATE HEALTH INSURANCE | Admitting: Gynecologic Oncology

## 2011-07-30 ENCOUNTER — Ambulatory Visit: Payer: Medicare Other | Attending: Gynecologic Oncology | Admitting: Gynecologic Oncology

## 2011-07-30 ENCOUNTER — Other Ambulatory Visit: Payer: Self-pay | Admitting: Gynecologic Oncology

## 2011-07-30 DIAGNOSIS — Z90710 Acquired absence of both cervix and uterus: Secondary | ICD-10-CM | POA: Insufficient documentation

## 2011-07-30 DIAGNOSIS — Z9079 Acquired absence of other genital organ(s): Secondary | ICD-10-CM | POA: Insufficient documentation

## 2011-07-30 DIAGNOSIS — C549 Malignant neoplasm of corpus uteri, unspecified: Secondary | ICD-10-CM | POA: Insufficient documentation

## 2011-07-30 DIAGNOSIS — M129 Arthropathy, unspecified: Secondary | ICD-10-CM | POA: Insufficient documentation

## 2011-07-30 DIAGNOSIS — M25569 Pain in unspecified knee: Secondary | ICD-10-CM | POA: Insufficient documentation

## 2011-07-30 DIAGNOSIS — Z8540 Personal history of malignant neoplasm of unspecified female genital organ: Secondary | ICD-10-CM | POA: Insufficient documentation

## 2011-08-01 NOTE — Consult Note (Signed)
NAMEMASAYO, FERA                 ACCOUNT NO.:  [id number]  MEDICAL RECORD NO.:  [id number]  LOCATION:  GYN                          FACILITY:  [facility]  PHYSICIAN:  Ahrianna Siglin A. Duard Brady, MD    DATE OF BIRTH:  1929-07-02  DATE OF CONSULTATION:  07/30/2011 DATE OF DISCHARGE:                                CONSULTATION   Ms. Mervin is a very pleasant 77 year old who underwent a TAHBSO bilateral pelvic lymph node sampling in September 2007 for a stage IB, grade 1 endometrial carcinoma.  Final pathology ruled a grade 1 lesion with 20% myometrial invasion.  Negative lymph nodes, negative adnexa.  No lymphovascular space involvement.  She was dispositioned to close followup.  I last saw her in March of this year, at which time, her exam and Pap smear were negative.  She comes in today for followup.  She is overall doing quite well.  She denies any significant complaint.  She denies any nausea, vomiting, fevers, chills, headaches, or visual changes.  She was very active with her church, baking a lot of cakes and pound cakes, getting ready for Christmas.  She bakes them and then freezes them and hands them out.  She continues to have some issues with arthritis.  She was given a dose of 50 mg b.i.d. diclofenac by her primary physician and that did not really help her and she wants a prescription for the 75 mg dose.  She denies any vaginal bleeding, any change in bowel or bladder habits, any unintentional weight loss or weight gain.  The biggest issue she has is her right knee pain.  She did have a steroid shot and was told that she had some cartilage damage in her knee.  She has a followup with the orthopedist in October.  She is very sad now, this is 5-year anniversary, and she will not be coming back to our clinic and she states she has very much enjoyed seeing [place]. She was kind enough to present [place] a pound cake today.  Medication list is reviewed and is unchanged.  FAMILY HISTORY:  There is no  new medical problems.  Health maintenance, she is up-to-date on her mammograms.  PHYSICAL EXAMINATION:  VITAL SIGNS:  Weight 135 pounds, which is down 10 pounds since March; blood pressure 130/80; respirations 20; and temperature 98.2. GENERAL:  Well-nourished, well-developed female, in no acute distress. NECK:  Supple.  There is no lymphadenopathy, no thyromegaly. LUNGS:  Clear to auscultation bilaterally. CARDIOVASCULAR EXAM:  Regular rate and rhythm. ABDOMEN:  Shows a well-healed surgical incision.  Abdomen is soft, nontender, and nondistended.  There were no palpable masses or hepatosplenomegaly.  Groins are negative for adenopathy. EXTREMITIES:  There is no edema.  She has some chronic venous stasis changes. PELVIC:  External genitalia is within normal limits though atrophic. The vagina is markedly atrophic.  The vaginal cuff is visualized.  There is no visible lesions.  ThinPrep Pap was done without difficulty. Bimanual examination reveals no masses or nodularity. RECTAL:  Confirms.  ASSESSMENT:  An 77 year old with a  FIGO stage IA, grade 1 endometrioid adenocarcinoma diagnosed and treated  5 years ago who has no evidence  of recurrent disease.  PLAN:  We will follow up the results for Pap smear from today.  She will be released from our clinic and will follow up with Dr. Oliva Bustard for her annual GYN care.     Demetric Parslow A. Duard Brady, MD     PAG/MEDQ  D:  07/30/2011  T:  07/30/2011  Job:  [id number]  cc:   Telford Nab, R.N. [address] [place], [place] [address]  Desmond Dike, MD Fax: [phone]  Oliva Bustard Fax: [phone]  Electronically Signed by Cleda Mccreedy MD on 08/01/2011 07:56:59 AM

## 2011-12-15 ENCOUNTER — Ambulatory Visit (INDEPENDENT_AMBULATORY_CARE_PROVIDER_SITE_OTHER): Payer: Medicare Other | Admitting: Ophthalmology

## 2015-12-01 ENCOUNTER — Inpatient Hospital Stay (HOSPITAL_COMMUNITY)
Admission: EM | Admit: 2015-12-01 | Discharge: 2015-12-05 | DRG: 064 | Disposition: A | Discharge status: Hospice | Payer: Medicare Other | Attending: Neurology | Admitting: Neurology

## 2015-12-01 ENCOUNTER — Emergency Department (HOSPITAL_COMMUNITY): Payer: Medicare Other

## 2015-12-01 ENCOUNTER — Encounter (HOSPITAL_COMMUNITY): Payer: Self-pay | Admitting: Emergency Medicine

## 2015-12-01 DIAGNOSIS — R4701 Aphasia: Secondary | ICD-10-CM | POA: Diagnosis present

## 2015-12-01 DIAGNOSIS — E785 Hyperlipidemia, unspecified: Secondary | ICD-10-CM | POA: Diagnosis present

## 2015-12-01 DIAGNOSIS — R54 Age-related physical debility: Secondary | ICD-10-CM | POA: Diagnosis not present

## 2015-12-01 DIAGNOSIS — G936 Cerebral edema: Secondary | ICD-10-CM | POA: Diagnosis not present

## 2015-12-01 DIAGNOSIS — Z515 Encounter for palliative care: Secondary | ICD-10-CM | POA: Diagnosis present

## 2015-12-01 DIAGNOSIS — R1319 Other dysphagia: Secondary | ICD-10-CM | POA: Diagnosis present

## 2015-12-01 DIAGNOSIS — I10 Essential (primary) hypertension: Secondary | ICD-10-CM | POA: Diagnosis present

## 2015-12-01 DIAGNOSIS — R29810 Facial weakness: Secondary | ICD-10-CM | POA: Diagnosis present

## 2015-12-01 DIAGNOSIS — I2510 Atherosclerotic heart disease of native coronary artery without angina pectoris: Secondary | ICD-10-CM | POA: Diagnosis not present

## 2015-12-01 DIAGNOSIS — D72829 Elevated white blood cell count, unspecified: Secondary | ICD-10-CM

## 2015-12-01 DIAGNOSIS — G8194 Hemiplegia, unspecified affecting left nondominant side: Secondary | ICD-10-CM | POA: Diagnosis not present

## 2015-12-01 DIAGNOSIS — Z9282 Status post administration of tPA (rtPA) in a different facility within the last 24 hours prior to admission to current facility: Secondary | ICD-10-CM | POA: Diagnosis not present

## 2015-12-01 DIAGNOSIS — I63411 Cerebral infarction due to embolism of right middle cerebral artery: Secondary | ICD-10-CM | POA: Diagnosis not present

## 2015-12-01 DIAGNOSIS — R29720 NIHSS score 20: Secondary | ICD-10-CM | POA: Diagnosis not present

## 2015-12-01 DIAGNOSIS — I639 Cerebral infarction, unspecified: Secondary | ICD-10-CM

## 2015-12-01 DIAGNOSIS — J690 Pneumonitis due to inhalation of food and vomit: Secondary | ICD-10-CM | POA: Diagnosis not present

## 2015-12-01 DIAGNOSIS — R451 Restlessness and agitation: Secondary | ICD-10-CM | POA: Diagnosis present

## 2015-12-01 DIAGNOSIS — I63511 Cerebral infarction due to unspecified occlusion or stenosis of right middle cerebral artery: Secondary | ICD-10-CM | POA: Diagnosis not present

## 2015-12-01 DIAGNOSIS — I4891 Unspecified atrial fibrillation: Secondary | ICD-10-CM

## 2015-12-01 DIAGNOSIS — Z66 Do not resuscitate: Secondary | ICD-10-CM | POA: Diagnosis present

## 2015-12-01 DIAGNOSIS — T17908A Unspecified foreign body in respiratory tract, part unspecified causing other injury, initial encounter: Secondary | ICD-10-CM

## 2015-12-01 DIAGNOSIS — Z4659 Encounter for fitting and adjustment of other gastrointestinal appliance and device: Secondary | ICD-10-CM

## 2015-12-01 DIAGNOSIS — I6789 Other cerebrovascular disease: Secondary | ICD-10-CM | POA: Diagnosis not present

## 2015-12-01 DIAGNOSIS — I481 Persistent atrial fibrillation: Secondary | ICD-10-CM | POA: Diagnosis not present

## 2015-12-01 HISTORY — DX: Atherosclerotic heart disease of native coronary artery without angina pectoris: I25.10

## 2015-12-01 HISTORY — DX: Essential (primary) hypertension: I10

## 2015-12-01 LAB — MRSA PCR SCREENING: MRSA BY PCR: NEGATIVE

## 2015-12-01 MED ORDER — LABETALOL HCL 5 MG/ML IV SOLN: 10.0000 mg | INTRAVENOUS | Status: DC | PRN

## 2015-12-01 MED ORDER — DILTIAZEM HCL 100 MG IV SOLR
5.0000 mg/h | Freq: Once | INTRAVENOUS | Status: AC
  Administered 2015-12-01: 5 mg/h via INTRAVENOUS
  Filled 2015-12-01: qty 100

## 2015-12-01 MED ORDER — STROKE: EARLY STAGES OF RECOVERY BOOK
Freq: Once | Status: AC
  Administered 2015-12-01: 18:00:00
  Filled 2015-12-01: qty 1

## 2015-12-01 MED ORDER — SENNOSIDES-DOCUSATE SODIUM 8.6-50 MG PO TABS: 1.0000 | ORAL_TABLET | Freq: Every evening | ORAL | Status: DC | PRN

## 2015-12-01 MED ORDER — LORAZEPAM 2 MG/ML IJ SOLN
1.0000 mg | Freq: Once | INTRAMUSCULAR | Status: AC
  Administered 2015-12-01: 1 mg via INTRAVENOUS
  Filled 2015-12-01: qty 1

## 2015-12-01 MED ORDER — ACETAMINOPHEN 650 MG RE SUPP
650.0000 mg | RECTAL | Status: DC | PRN
  Administered 2015-12-02 (×2): 650 mg via RECTAL
  Filled 2015-12-01 (×2): qty 1

## 2015-12-01 MED ORDER — LORAZEPAM 2 MG/ML IJ SOLN
2.0000 mg | INTRAMUSCULAR | Status: DC | PRN
  Administered 2015-12-01 – 2015-12-02 (×3): 2 mg via INTRAVENOUS
  Filled 2015-12-01 (×3): qty 1

## 2015-12-01 MED ORDER — PANTOPRAZOLE SODIUM 40 MG IV SOLR
40.0000 mg | Freq: Every day | INTRAVENOUS | Status: DC
  Administered 2015-12-01 – 2015-12-03 (×3): 40 mg via INTRAVENOUS
  Filled 2015-12-01 (×3): qty 40

## 2015-12-01 MED ORDER — DILTIAZEM HCL 100 MG IV SOLR
5.0000 mg/h | INTRAVENOUS | Status: DC
  Administered 2015-12-01 – 2015-12-04 (×4): 15 mg/h via INTRAVENOUS
  Filled 2015-12-01 (×5): qty 100

## 2015-12-01 MED ORDER — SODIUM CHLORIDE 0.9 % IV SOLN
INTRAVENOUS | Status: DC
  Administered 2015-12-02 – 2015-12-03 (×3): via INTRAVENOUS

## 2015-12-01 MED ORDER — IOHEXOL 350 MG/ML SOLN
50.0000 mL | Freq: Once | INTRAVENOUS | Status: AC | PRN
  Administered 2015-12-01: 40 mL via INTRAVENOUS

## 2015-12-01 MED ORDER — ACETAMINOPHEN 325 MG PO TABS
650.0000 mg | ORAL_TABLET | ORAL | Status: DC | PRN
  Filled 2015-12-01: qty 2

## 2015-12-01 NOTE — ED Notes (Signed)
Patient transported to CT 

## 2015-12-01 NOTE — Progress Notes (Signed)
Called Neuro on call to discuss shift plan, pt HR remains uncontrolled on Cardizem at this time, pt is extremely restless and appears to be uncomfortable/agitated. I also asked if we were going to discuss Code status with the family since they are all here at this time and no intervention is possible (per Dr. Aram Beecham).  MD gave verbal orders for [facility] and does not plan to have Code status conversation with the family at this time.   Evelyn Patterson

## 2015-12-01 NOTE — ED Notes (Addendum)
Upon removing Kerlix wrapping surrounding patients IV X 2 that she arrived to ED with, both are noted to be infiltrated. Golf ball sized fluid infiltrate to right forearm with red streak. Left antecubital IV with obvious infiltrate and edema surrounding IV site with bruising. Both IV's removed.  Pt received tPA at 1448 that was stopped at 1540.

## 2015-12-01 NOTE — ED Provider Notes (Signed)
CSN: [id number]     Arrival date & time 12/01/15  1608 History   First MD Initiated Contact with Patient 12/01/15 1617     Chief Complaint  Patient presents with  . Code Stroke     (Consider location/radiation/quality/duration/timing/severity/associated sxs/prior Treatment) HPI Patient presents as transfer from [facility] by [place]. She was given TPA in consultation with neurologist. Unclear onset of symptoms per paramedics. States son broke down the patient's door at 11:30-12. Found patient lying on the floor. Patient last been seen by family at 68 yesterday afternoon. It was assumed by son that the patient had eaten lunch. Patient exhibited left sided upper and lower extremity weakness. CT without any acute findings. CT angiogram with right-sided CVA. Patient became agitated in route. She was given Ativan. Patient is currently nonverbal and not following commands. Level V caveat applies. Past Medical History  Diagnosis Date  . Hypertension   . Coronary artery disease    Past Surgical History  Procedure Laterality Date  . Abdominal hysterectomy     No family history on file. Social History  Substance Use Topics  . Smoking status: None  . Smokeless tobacco: None  . Alcohol Use: None   OB History    No data available     Review of Systems  Unable to perform ROS: Mental status change      Allergies  Review of patient's allergies indicates no known allergies.  Home Medications   Prior to Admission medications   Not on File   BP 116/67 mmHg  Pulse 120  Temp(Src) 98.2 F (36.8 C) (Axillary)  Resp 25  Ht 5\' 1"  (1.549 m)  Wt 139 lb 15.9 oz (63.5 kg)  BMI 26.46 kg/m2  SpO2 99% Physical Exam  Constitutional: She appears well-developed and well-nourished.  Mild agitation  HENT:  Head: Normocephalic and atraumatic.  Mouth/Throat: Oropharynx is clear and moist.  Eyes: EOM are normal. Pupils are equal, round, and reactive to light.  Pupils 3 mm bilaterally  and minimally responsive.  Neck: Normal range of motion. Neck supple.  No meningismus  Cardiovascular: Exam reveals no gallop and no friction rub.   No murmur heard. Tachycardia, irregularly irregular  Pulmonary/Chest: Effort normal and breath sounds normal. No respiratory distress. She has no wheezes. She has no rales. She exhibits no tenderness.  Abdominal: Soft. Bowel sounds are normal. She exhibits no distension and no mass. There is no tenderness. There is no rebound and no guarding.  Musculoskeletal: Normal range of motion. She exhibits no edema or tenderness.  Neurological: She is alert.  Patient is moving right upper and right lower extremities. She is not following commands. Flaccid left-sided paralysis. Upgoing toes with Babinski bilaterally.  Skin: Skin is warm and dry. No rash noted. No erythema.  Nursing note and vitals reviewed.   ED Course  Procedures (including critical care time) Labs Review Labs Reviewed  MRSA PCR SCREENING  LIPID PANEL  HEMOGLOBIN A1C  CBC  BASIC METABOLIC PANEL    Imaging Review Mr Brain Wo Contrast  12/02/2015  CLINICAL DATA:  82 year old female with hypertension and left-sided weakness with altered mental status. Subsequent encounter. EXAM: MRI HEAD WITHOUT CONTRAST MRA HEAD WITHOUT CONTRAST TECHNIQUE: Multiplanar, multiecho pulse sequences of the brain and surrounding structures were obtained without intravenous contrast. Angiographic images of the head were obtained using MRA technique without contrast. COMPARISON:  12/02/2015 CT profusion study and head CT and CT angiogram. FINDINGS: MRI HEAD FINDINGS Large right middle cerebral artery distribution infarct involving  right temporal lobe, right operculum region, right subinsular region, right frontal lobe and right parietal lobe with mild mass effect upon the right lateral ventricle without midline shift. No associated hemorrhage. Thrombus within right middle cerebral artery bifurcation/branch  vessel. No intracranial mass lesion noted on this unenhanced exam. Global atrophy. Cervical medullary junction, pituitary region, pineal region and orbital structures unremarkable. MRA HEAD FINDINGS Abrupt cut off of flow right middle cerebral artery bifurcation. There is an early branch vessel supplying the anterior temporal lobe which originates proximal to the obstruction otherwise no right middle cerebral artery branches are noted. Ectatic internal carotid arteries without high-grade stenosis. No significant stenosis carotid terminus. No significant stenosis left middle cerebral artery proximal branches or anterior cerebral artery on either side. Ectatic vertebral arteries and basilar artery without significant stenosis. Nonvisualized anterior inferior cerebellar arteries. Mild narrowing distal left posterior cerebral artery branch vessel. IMPRESSION: MRI HEAD Large right middle cerebral artery distribution infarct with mild mass effect upon the right lateral ventricle without midline shift. No associated hemorrhage. Thrombus within right middle cerebral artery bifurcation/branch vessel. Global atrophy. MRA HEAD Abrupt cut off of flow right middle cerebral artery bifurcation. There is an early branch vessel supplying the anterior temporal lobe which originates proximal to the obstruction otherwise no right middle cerebral artery branches are noted. Electronically Signed   By: Genia Del M.D.   On: 12/02/2015 15:58   Ct Cerebral Perfusion W/cm  12/01/2015  CLINICAL DATA:  82 year old female with left-sided weakness. Subsequent encounter. EXAM: CT CEREBRAL PERFUSION WITH CONTRAST TECHNIQUE: Perfusion maps obtained. CONTRAST:  31mL OMNIPAQUE IOHEXOL 350 MG/ML SOLN COMPARISON:  CT angiogram 12/01/2015. FINDINGS: Large nonhemorrhagic right middle cerebral artery distribution infarct. Small surrounding ischemic penumbra. Patient at risk for development of hemorrhage and mass effect given the size of the infarct.  IMPRESSION: Large nonhemorrhagic right middle cerebral artery distribution infarct. Small surrounding ischemic penumbra. Patient at risk for development of hemorrhage and mass effect given the size of the infarct. These results were called by telephone at the time of interpretation on 12/01/2015 at 6:26 pm to Dr. Dorian Pod , who verbally acknowledged these results. Electronically Signed   By: Genia Del M.D.   On: 12/01/2015 18:59   Mr Mra Head/brain Wo Cm  12/02/2015  CLINICAL DATA:  82 year old female with hypertension and left-sided weakness with altered mental status. Subsequent encounter. EXAM: MRI HEAD WITHOUT CONTRAST MRA HEAD WITHOUT CONTRAST TECHNIQUE: Multiplanar, multiecho pulse sequences of the brain and surrounding structures were obtained without intravenous contrast. Angiographic images of the head were obtained using MRA technique without contrast. COMPARISON:  12/02/2015 CT profusion study and head CT and CT angiogram. FINDINGS: MRI HEAD FINDINGS Large right middle cerebral artery distribution infarct involving right temporal lobe, right operculum region, right subinsular region, right frontal lobe and right parietal lobe with mild mass effect upon the right lateral ventricle without midline shift. No associated hemorrhage. Thrombus within right middle cerebral artery bifurcation/branch vessel. No intracranial mass lesion noted on this unenhanced exam. Global atrophy. Cervical medullary junction, pituitary region, pineal region and orbital structures unremarkable. MRA HEAD FINDINGS Abrupt cut off of flow right middle cerebral artery bifurcation. There is an early branch vessel supplying the anterior temporal lobe which originates proximal to the obstruction otherwise no right middle cerebral artery branches are noted. Ectatic internal carotid arteries without high-grade stenosis. No significant stenosis carotid terminus. No significant stenosis left middle cerebral artery proximal branches  or anterior cerebral artery on either side. Ectatic vertebral arteries and  basilar artery without significant stenosis. Nonvisualized anterior inferior cerebellar arteries. Mild narrowing distal left posterior cerebral artery branch vessel. IMPRESSION: MRI HEAD Large right middle cerebral artery distribution infarct with mild mass effect upon the right lateral ventricle without midline shift. No associated hemorrhage. Thrombus within right middle cerebral artery bifurcation/branch vessel. Global atrophy. MRA HEAD Abrupt cut off of flow right middle cerebral artery bifurcation. There is an early branch vessel supplying the anterior temporal lobe which originates proximal to the obstruction otherwise no right middle cerebral artery branches are noted. Electronically Signed   By: Genia Del M.D.   On: 12/02/2015 15:58   I have personally reviewed and evaluated these images and lab results as part of my medical decision-making.   EKG Interpretation None      MDM   Final diagnoses:  Stroke [facility])   Discussed with Dr. Aram Beecham will see patient in the emergency department. Patient coming increasingly agitated. Yolanda Bonine is at bedside. Yolanda Bonine states that he arrived to the patient's residence 5-10 minutes before noon today. States he had to breakdown the door. Found the patient on the floor. She had a flaccid left side paralysis at that time. He states she was repeatedly asking to go to the bathroom. He does not believe that she was confused. Grandson also states the patient had taken her 11 AM medication and had eaten lunch because he saw the trash from a TV dinner set aside to be taken to the cycling. Patient has no history of stroke. No history of arrhythmia. Received 25 mg of Cardizem for atrial fibrillation with RVR and also was given 2 mg of Ativan.     Julianne Rice, MD 12/02/15 2228

## 2015-12-01 NOTE — ED Notes (Signed)
Pt in [place] from [place], per family went to go check on today at 1130, son had to break down door bc pt not answering. Unknown LNW but [facility] stating it was 1130am, family says there was empty meal tray on table and they assume it was "lunch". Pt was given TPA before leaving [place]. Pt nonverbal, not responding

## 2015-12-01 NOTE — Consult Note (Addendum)
Referring Physician: [facility] hospital ED    Chief Complaint: left hemiparesis, altered mental status, s/p iv tPA  HPI:                                                                                                                                         Evelyn Patterson is an 82 y.o. female with a past medical history significant for HTN and HLD, evaluated at [facility] ED this morning due to the above stated symptoms. Conflicting information about patient last known well: as per [facility] ED patient was last known well at 11:30 this morning. However, her son is at the bedside and said that he last saw her normal at 5 pm yesterday and today he went to go check on her at 43 and had to break down door because pt was not answering. He found patient on the floor, responsive but with less movements in the left side. Patient was taken to [facility] ED where she had CT brain that was reportedly negative for acute abnormality, but CTA with abrupt cuff off right MCA bifurcation. She was given iv tPA and [facility] ED contacted interventional neuroradiologist and patient was transferred to [facility] for further evaluation and likely endovascular intervention. Presently, patient is restless, does not speak or follow commands, and has decreased motility left arm. As per son, patient was given some sedation at [facility] ED  Date last known well: 12/01/15 Time last known well: 11:30 am tPA Given: yes NIHSS: 20 MRS: 0  History reviewed. No pertinent past medical history.  History reviewed. No pertinent past surgical history.  No family history on file. Social History:  has no tobacco, alcohol, and drug history on file.  Allergies: No Known Allergies  Medications:                                                                                                                           I have reviewed the patient's current medications.  ROS: unable to obtain due to mental status  History obtained from chart review and son   Physical exam:  Constitutional: well developed, female in no apparent distress. Eyes: no jaundice or exophthalmos.  Head: normocephalic. Neck: supple, no bruits, no JVD. Cardiac: no murmurs. Lungs: clear. Abdomen: soft, no tender, no mass. Extremities: no edema, clubbing, or cyanosis.  Skin: no rash   Neurologic Examination:                                                                                                      General: NAD Mental Status: Lethargic (received sedatives at [facility] ED), does not follow commands, non verbal Cranial Nerves: II: Discs flat bilaterally; Does not blink to threat, pupils equal, round, reactive to light III,IV, VI: ptosis not present, seems to have a right gaze preference V,VII: smile asymmetric due to left face weakness, facial light touch sensation can not be tested reliably VIII: hearing grossly normal bilaterally IX,X: uvula rises symmetrically XI: bilateral shoulder shrug no tested XII: midline tongue  Motor: Significant for left UE monoparesis Tone and bulk:normal tone throughout; no atrophy noted Sensory: reacts to pain Deep Tendon Reflexes:  Right: Upper Extremity   Left: Upper extremity   biceps (C-5 to C-6) 2/4   biceps (C-5 to C-6) 2/4 tricep (C7) 2/4    triceps (C7) 2/4 Brachioradialis (C6) 2/4  Brachioradialis (C6) 2/4  Lower Extremity Lower Extremity  quadriceps (L-2 to L-4) 2/4   quadriceps (L-2 to L-4) 2/4 Achilles (S1) 2/4   Achilles (S1) 2/4  Plantars: Right: upgoing   Left: upgoing Cerebellar: Unable to test due to mental status Gait: Unable to test due to mental status   No results found for this or any previous visit (from the past 48 hour(s)). No results found.    Assessment: 82 y.o. female with likely right cortical infarct transferred to [facility] after  receiving iv tPA at [facility] ED. CTA brain reported as showing an abrupt cutoff at the right MCA bifurcation. NIHSS 20. Conflicting information regarding last known well but apparently she was last known well at 11:30 am. Discussed with interventional neuroradiologist, and decided to obtain CT perfusion  taking into consideration that the last known well in reality is not known. Will make further decision after CTP is completed.  Stroke Risk Factors - age, HTN, HLD  Plan: 1. HgbA1c, fasting lipid panel 2. MRI, MRA  of the brain without contrast 3. Echocardiogram 4. Carotid dopplers 5. Prophylactic therapy-as per post post iv tPA protocol 6. Risk factor modification 7. Telemetry monitoring 8. Frequent neuro checks 9. PT/OT SLP  [facility] [phone] 12/01/2015, 5:27 PM  Addendum: CTP Large nonhemorrhagic right middle cerebral artery distribution infarct. Small surrounding ischemic penumbra. Spoke with interventional radiologist and no intervention prudent.

## 2015-12-02 ENCOUNTER — Inpatient Hospital Stay (HOSPITAL_COMMUNITY): Payer: Medicare Other

## 2015-12-02 ENCOUNTER — Other Ambulatory Visit (HOSPITAL_COMMUNITY): Payer: PRIVATE HEALTH INSURANCE

## 2015-12-02 DIAGNOSIS — I63511 Cerebral infarction due to unspecified occlusion or stenosis of right middle cerebral artery: Secondary | ICD-10-CM

## 2015-12-02 LAB — LIPID PANEL
CHOLESTEROL: 148 mg/dL (ref 0–200)
HDL: 52 mg/dL (ref >40)
LDL Cholesterol: 83 mg/dL (ref 0–99)
TRIGLYCERIDES: 67 mg/dL (ref <150)
Total CHOL/HDL Ratio: 2.8 RATIO
VLDL: 13 mg/dL (ref 0–40)

## 2015-12-02 MED ORDER — LORAZEPAM 2 MG/ML IJ SOLN
1.0000 mg | INTRAMUSCULAR | Status: DC | PRN
  Administered 2015-12-02 – 2015-12-04 (×5): 1 mg via INTRAVENOUS
  Filled 2015-12-02 (×6): qty 1

## 2015-12-02 MED ORDER — FENTANYL CITRATE (PF) 100 MCG/2ML IJ SOLN
25.0000 ug | INTRAMUSCULAR | Status: DC | PRN
  Administered 2015-12-02 – 2015-12-04 (×8): 50 ug via INTRAVENOUS
  Filled 2015-12-02 (×8): qty 2

## 2015-12-02 MED ORDER — CHLORHEXIDINE GLUCONATE 0.12 % MT SOLN
15.0000 mL | Freq: Two times a day (BID) | OROMUCOSAL | Status: DC
  Administered 2015-12-02 – 2015-12-03 (×4): 15 mL via OROMUCOSAL
  Filled 2015-12-02: qty 15

## 2015-12-02 MED ORDER — ASPIRIN EC 325 MG PO TBEC
325.0000 mg | DELAYED_RELEASE_TABLET | Freq: Every day | ORAL | Status: DC
  Filled 2015-12-02: qty 1

## 2015-12-02 MED ORDER — ASPIRIN 300 MG RE SUPP
300.0000 mg | Freq: Once | RECTAL | Status: AC
  Administered 2015-12-02: 300 mg via RECTAL
  Filled 2015-12-02: qty 1

## 2015-12-02 MED ORDER — CETYLPYRIDINIUM CHLORIDE 0.05 % MT LIQD
7.0000 mL | Freq: Two times a day (BID) | OROMUCOSAL | Status: DC
  Administered 2015-12-02 – 2015-12-03 (×4): 7 mL via OROMUCOSAL

## 2015-12-02 NOTE — Progress Notes (Signed)
STROKE TEAM PROGRESS NOTE   HISTORY OF PRESENT ILLNESS Evelyn Patterson is an 82 y.o. female with a past medical history significant for HTN and HLD, evaluated at [facility] ED this morning due to altered mental status and left hemiparesis. Conflicting information about patient last known well: as per [facility] ED patient was last known well at 11: 30 this morning. However, her son is at the bedside and said that he last saw her normal at 5 pm yesterday and today he went to go check on her at 74 and had to break down door because pt was not answering. He found patient on the floor, responsive but with less movements in the left side. Patient was taken to [facility] ED where she had CT brain that was reportedly negative for acute abnormality, but CTA with abrupt cuff off right MCA bifurcation. She was given iv tPA and [facility] ED contacted interventional neuroradiologist and patient was transferred to [facility] for further evaluation and likely endovascular intervention. Presently, patient is restless, does not speak or follow commands, and has decreased motility left arm. As per son, patient was given some sedation at [facility] ED  Date last known well: 12/01/15 Time last known well: 5 pm tPA Given: yes NIHSS: 20 MRS: 0   SUBJECTIVE (INTERVAL HISTORY) He family is at the bedside. Discussed rather large right MCA stroke. They have elected to make patient DNR/DNI.    OBJECTIVE Temp:  [97.4 F (36.3 C)-100.3 F (37.9 C)] 99.3 F (37.4 C) (01/22 1200) Pulse Rate:  [25-146] 70 (01/22 1100) Cardiac Rhythm:  [-] Atrial fibrillation (01/22 0800) Resp:  [16-33] 20 (01/22 1200) BP: (94-172)/(41-104) 104/57 mmHg (01/22 1200) SpO2:  [92 %-100 %] 100 % (01/22 1100) Weight:  [63.5 kg (139 lb 15.9 oz)] 63.5 kg (139 lb 15.9 oz) (01/21 1811)  CBC: No results for input(s): WBC, NEUTROABS, HGB, HCT, MCV, PLT in the last 168 hours.  Basic Metabolic Panel: No results for input(s): NA, K, CL, CO2, GLUCOSE,  BUN, CREATININE, CALCIUM, MG, PHOS in the last 168 hours.  Lipid Panel:     Component Value Date/Time   CHOL 148 12/02/2015 0423   TRIG 67 12/02/2015 0423   HDL 52 12/02/2015 0423   CHOLHDL 2.8 12/02/2015 0423   VLDL 13 12/02/2015 0423   LDLCALC 83 12/02/2015 0423   HgbA1c: No results found for: HGBA1C Urine Drug Screen: No results found for: LABOPIA, COCAINSCRNUR, LABBENZ, AMPHETMU, THCU, LABBARB    IMAGING  Ct Cerebral Perfusion W/cm 12/01/2015   Large nonhemorrhagic right middle cerebral artery distribution infarct. Small surrounding ischemic penumbra. Patient at risk for development of hemorrhage and mass effect given the size of the infarct.   PHYSICAL EXAM Frail elderly patient who is not intubated but somnolent, she has been given multiple doses of ativan due to agitation. Afebrile. Head is nontraumatic. Neck is supple without bruit. Cardiac exam no murmur or gallop. Lungs are clear to auscultation. Distal pulses are well felt. Neurological Exam : Sleeping, snoring. Does not open eyes to sternal rub. oes not follow commands. She is non verbal. PERRL. Conjugate gaze, does not track. Unable to assess visual acuity, attempted. Left facial weakness.  Tongue midline. Cough and gag intact. Purposeful right arm movements.Moves right leg spontaneously.  No purposeful movements on the left.  Postures on the left upper extremity with noxious stimuli. Withdraws on the other extremities with noxious stimuli.toes upgoing.   Gait: Unable to test     ASSESSMENT/PLAN [name] is  a 82 y.o. female with history of hypertension, hyperlipidemia, and coronary artery disease presenting with decreased responsiveness, left hemiparesis, and aphasia. She received IV t-PA at [facility].  Stroke:  Non-dominant infarct probably embolic from an unknown source.  Resultant  Left hemiparesis, patient is somnolent difficult to evaluate for full extent of deficits  MRI - pending  MRA -  pending  Carotid Doppler - pending  2D Echo - pending  LDL - 83  HgbA1c pending  VTE prophylaxis - SCDs  Diet NPO time specified  No antithrombotic prior to admission, now on No antithrombotic secondary to TPA therapy.  Ongoing aggressive stroke risk factor management  Therapy recommendations:  Pending ( patient on bedrest )  Disposition: Pending   Hypertension  Stable to somewhat low.  Permissive hypertension (OK if < 220/120) but gradually normalize in 5-7 days   Hyperlipidemia  Home meds: No lipid lowering medications prior to admission  LDL 83, goal < 70  The patient is currently NPO     Other Stroke Risk Factors  Advanced age  Coronary artery disease   Other Active Problems    Hospital day # [place] PA-C [facility] Pager [phone] 12/02/2015, 1:43 PM   Personally examined patient and images, and have participated in and made any corrections needed to history, physical, neuro exam,assessment and plan as stated above.  I have personally obtained the history, evaluated lab date, reviewed imaging studies and agree with radiology interpretations. This patient is critically ill and at significant risk of neurological worsening, death and care requires constant monitoring of vital signs, hemodynamics,respiratory and cardiac monitoring,review of multiple databases, neurological assessment, discussion with family, other specialists and medical decision making of high complexity.  He family is at the bedside. Discussed rather large right MCA stroke. They have elected to make patient DNR/DNI.   I spent 30 minutes of neurocritical care time in the care of this patient.  Sarina Ill, MD [facility] Pager: [phone] 02/02/2015 5:09 PM     To contact Stroke Continuity provider, please refer to [email or web address] After hours, contact General Neurology

## 2015-12-02 NOTE — CV Procedure (Signed)
Unable to perform echocardiogram, the patient was on her way to MRI.   Darlina Sicilian [facility] 12/01/2014 2:00 PM

## 2015-12-02 NOTE — Progress Notes (Signed)
SLP Cancellation Note  Patient Details Name: Evelyn Patterson MRN: [id number] DOB: Jun 15, 1929   Cancelled treatment:        Pt sleeping, spoke with RN. Pt has received approximately 6 mg of Ativan over 12 hour period. ST will return next date for swallow assessment.   Houston Siren 12/02/2015, 10:57 AM [phone]

## 2015-12-02 NOTE — Progress Notes (Signed)
Notified Dr. Nicole Kindred that patient's heart rate continues to be elevated at a rate of 120s-150s with cardizem maxed out at 15mg /hr. Also notified him that patient is increasingly agitated and asked for something for agitation. Stated to give ativan 1 mg IV q3hprn agitation. Will continue to monitor patient.

## 2015-12-02 NOTE — Progress Notes (Signed)
0328 paged neurology regarding HR uncontrolled on Cardizem gtt and BP declining. Orders given for 148ml/hr NS per Dr. Nicole Kindred.

## 2015-12-02 NOTE — Progress Notes (Signed)
PT Cancellation Note  Patient Details Name: Evelyn Patterson MRN: [id number] DOB: 07-Mar-1929   Cancelled Treatment:    Reason Eval/Treat Not Completed: Patient not medically ready (Pt is on strict bed rest).  Will need updated activity order to complete PT evaluation once medically appropriate.  Thank you for this order.  Joslyn Hy PT, DPT [phone] Pager: [phone] 12/02/2015, 8:48 AM

## 2015-12-03 ENCOUNTER — Inpatient Hospital Stay (HOSPITAL_COMMUNITY): Payer: Medicare Other

## 2015-12-03 ENCOUNTER — Encounter (HOSPITAL_COMMUNITY): Payer: Self-pay | Admitting: Nurse Practitioner

## 2015-12-03 DIAGNOSIS — I4891 Unspecified atrial fibrillation: Secondary | ICD-10-CM

## 2015-12-03 DIAGNOSIS — I6789 Other cerebrovascular disease: Secondary | ICD-10-CM

## 2015-12-03 DIAGNOSIS — I639 Cerebral infarction, unspecified: Secondary | ICD-10-CM

## 2015-12-03 DIAGNOSIS — G936 Cerebral edema: Secondary | ICD-10-CM

## 2015-12-03 DIAGNOSIS — I481 Persistent atrial fibrillation: Secondary | ICD-10-CM

## 2015-12-03 LAB — BASIC METABOLIC PANEL
Anion gap: 11 (ref 5–15)
BUN: 10 mg/dL (ref 6–20)
CALCIUM: 9 mg/dL (ref 8.9–10.3)
CO2: 20 mmol/L — ABNORMAL LOW (ref 22–32)
CREATININE: 0.91 mg/dL (ref 0.44–1.00)
Chloride: 111 mmol/L (ref 101–111)
GFR calc Af Amer: >60 mL/min (ref >60)
GFR, EST NON AFRICAN AMERICAN: 56 mL/min — AB (ref >60)
Glucose, Bld: 102 mg/dL — ABNORMAL HIGH (ref 65–99)
Potassium: 4.1 mmol/L (ref 3.5–5.1)
SODIUM: 142 mmol/L (ref 135–145)

## 2015-12-03 LAB — HEMOGLOBIN A1C
Hgb A1c MFr Bld: 6 % — ABNORMAL HIGH (ref 4.8–5.6)
MEAN PLASMA GLUCOSE: 126 mg/dL

## 2015-12-03 LAB — CBC
HCT: 42.7 % (ref 36.0–46.0)
Hemoglobin: 14.6 g/dL (ref 12.0–15.0)
MCH: 32.4 pg (ref 26.0–34.0)
MCHC: 34.2 g/dL (ref 30.0–36.0)
MCV: 94.9 fL (ref 78.0–100.0)
PLATELETS: 190 10*3/uL (ref 150–400)
RBC: 4.5 MIL/uL (ref 3.87–5.11)
RDW: 13.2 % (ref 11.5–15.5)
WBC: 12 10*3/uL — ABNORMAL HIGH (ref 4.0–10.5)

## 2015-12-03 MED ORDER — METOPROLOL TARTRATE 25 MG PO TABS
12.5000 mg | ORAL_TABLET | Freq: Two times a day (BID) | ORAL | Status: DC
  Administered 2015-12-03 (×2): 12.5 mg
  Filled 2015-12-03 (×3): qty 1

## 2015-12-03 MED ORDER — DILTIAZEM HCL 25 MG/5ML IV SOLN
10.0000 mg | Freq: Once | INTRAVENOUS | Status: DC
  Filled 2015-12-03: qty 5

## 2015-12-03 MED ORDER — ASPIRIN 325 MG PO TABS
325.0000 mg | ORAL_TABLET | Freq: Every day | ORAL | Status: DC
  Administered 2015-12-03: 325 mg
  Filled 2015-12-03 (×2): qty 1

## 2015-12-03 MED ORDER — JEVITY 1.2 CAL PO LIQD
1000.0000 mL | ORAL | Status: DC
  Administered 2015-12-03: 20 mL/h
  Filled 2015-12-03 (×3): qty 1000

## 2015-12-03 MED ORDER — LOSARTAN POTASSIUM 50 MG PO TABS
50.0000 mg | ORAL_TABLET | Freq: Every day | ORAL | Status: DC
  Administered 2015-12-03: 50 mg
  Filled 2015-12-03 (×2): qty 1

## 2015-12-03 MED ORDER — SIMVASTATIN 40 MG PO TABS
40.0000 mg | ORAL_TABLET | Freq: Every day | ORAL | Status: DC
  Administered 2015-12-03: 40 mg
  Filled 2015-12-03 (×2): qty 1

## 2015-12-03 MED ORDER — ASPIRIN 325 MG PO TABS: 325.0000 mg | ORAL_TABLET | Freq: Every day | ORAL | Status: DC

## 2015-12-03 NOTE — Progress Notes (Signed)
*  PRELIMINARY RESULTS* Vascular Ultrasound Carotid Duplex (Doppler) has been completed.   Study was technically difficult and limited due to extremely poor patient cooperation, patient's combative state, movement, and patient position. Findings suggest 1-39% internal carotid artery stenosis involving visualized segments. The right vertebral artery is patent with antegrade flow. Unable to visualize the left vertebral artery due to technical difficulties.  12/03/2015 10:05 AM Maudry Mayhew, RVT, RDCS, RDMS

## 2015-12-03 NOTE — Progress Notes (Signed)
Echocardiogram 2D Echocardiogram has been performed.  Evelyn Patterson 12/03/2015, 12:34 PM

## 2015-12-03 NOTE — Progress Notes (Signed)
PT Cancellation Note  Patient Details Name: Evelyn Patterson MRN: [id number] DOB: Sep 24, 1929   Cancelled Treatment:    Reason Eval/Treat Not Completed: Patient not medically ready.  Spoke with MD and will hold PT and mobility today.  Will f/u tomorrow as appropriate.     Gentri Guardado, Thornton Papas 12/03/2015, 10:00 AM

## 2015-12-03 NOTE — Progress Notes (Signed)
Initial Nutrition Assessment  INTERVENTION:  Initiate Jevity 1.2 @ 20 ml/hr via Cortrak tube (tip in 2nd portion of duodenum) and increase by 10 ml every 4 hours to goal rate of 50 ml/hr.   Tube feeding regimen provides 1440 kcal, 67 grams of protein, and 972 ml of H2O.   NUTRITION DIAGNOSIS:   Inadequate oral intake related to inability to eat as evidenced by NPO status.  GOAL:   Patient will meet greater than or equal to 90% of their needs  MONITOR:   TF tolerance, Diet advancement, Vent status, Labs, I & O's  REASON FOR ASSESSMENT:   Consult Enteral/tube feeding initiation and management  ASSESSMENT:   Pt admitted from home via [facility] with a large R MCA infarct, embolic secondary to new onset atrial fibrillation.  1/23: Cortrak placed, tip in duodenum Sister at bedside, she is from out of town. Does report that pt lives alone and is able to care for herself.  Pt discussed during ICU rounds and with RN.  Nutrition-Focused physical exam completed. Findings are no fat depletion, no muscle depletion, and no edema.    Diet Order:  Diet NPO time specified  Skin:  Reviewed, no issues  Last BM:  1/23  Height:   Ht Readings from Last 1 Encounters:  12/01/15 5\' 1"  (1.549 m)   Weight:   Wt Readings from Last 1 Encounters:  12/01/15 139 lb 15.9 oz (63.5 kg)   Ideal Body Weight:  47.7 kg  BMI:  Body mass index is 26.46 kg/(m^2).  Estimated Nutritional Needs:   Kcal:  1300-1500  Protein:  65-80 grams  Fluid:  > 1.5 L/day  EDUCATION NEEDS:   No education needs identified at this time  [place], [place], [place] Pager [phone] After Hours Pager

## 2015-12-03 NOTE — Progress Notes (Signed)
STROKE TEAM PROGRESS NOTE   HISTORY OF PRESENT ILLNESS Evelyn Patterson is an 82 y.o. female with a past medical history significant for HTN and HLD, evaluated at [facility] ED due to altered mental status and left hemiparesis. Conflicting information about patient last known well - as per [facility] ED patient was last known well at 11:30 this morning 12/01/2015. However, her grandson is at the bedside and said that he last saw her normal at 5 pm yesterday 11/30/2015 and today he went to go check on her at 23 and had to break down door because pt was not answering. He found patient on the floor, responsive but with less movements in the left side. (he felt the stroke had just happened because she had taken her morning medicine, had been cleaning, and had already eaten her lunch). Patient was taken to [facility] ED where she had CT brain that was reportedly negative for acute abnormality, but CTA with abrupt cuff off right MCA bifurcation. She was given IV tPA and [facility] ED contacted interventional neuroradiologist at [facility] and patient was transferred to [facility] for further evaluation and likely endovascular intervention.   Upon arrival, patient is restless, does not speak or follow commands, and has decreased motility left arm. NIHSS: 20. MRS: 0. As per grandson, patient was given some sedation at [facility] ED.   CT perfusion performed. There is no salvageable penumbra. She was admitted to the neuro ICU.   SUBJECTIVE (INTERVAL HISTORY) Her grandson is at the bedside. He takes care of her - it is only he and his uncle. Pt was fully independent PTA. she raised her grandson. Neurological condition remains unchanged. MRI confirms a large right MCA infarct   OBJECTIVE Temp:  [98.2 F (36.8 C)-99.5 F (37.5 C)] 98.4 F (36.9 C) (01/23 0800) Pulse Rate:  [25-152] 120 (01/23 0800) Cardiac Rhythm:  [-] Atrial fibrillation (01/23 0600) Resp:  [17-32] 21 (01/23 0800) BP: (90-154)/(40-120) 133/92 mmHg (01/23  0800) SpO2:  [84 %-100 %] 96 % (01/23 0800)  CBC:   Recent Labs Lab 12/03/15 0334  WBC 12.0*  HGB 14.6  HCT 42.7  MCV 94.9  PLT 99991111    Basic Metabolic Panel:   Recent Labs Lab 12/03/15 0334  NA 142  K 4.1  CL 111  CO2 20*  GLUCOSE 102*  BUN 10  CREATININE 0.91  CALCIUM 9.0    Lipid Panel:     Component Value Date/Time   CHOL 148 12/02/2015 0423   TRIG 67 12/02/2015 0423   HDL 52 12/02/2015 0423   CHOLHDL 2.8 12/02/2015 0423   VLDL 13 12/02/2015 0423   LDLCALC 83 12/02/2015 0423   HgbA1c: No results found for: HGBA1C Urine Drug Screen: No results found for: LABOPIA, COCAINSCRNUR, LABBENZ, AMPHETMU, THCU, LABBARB    IMAGING  Ct Cerebral Perfusion W/cm 12/01/2015   Large nonhemorrhagic right middle cerebral artery distribution infarct. Small surrounding ischemic penumbra. Patient at risk for development of hemorrhage and mass effect given the size of the infarct.   MRI HEAD  12/02/2015  Large right middle cerebral artery distribution infarct with mild mass effect upon the right lateral ventricle without midline shift. No associated hemorrhage. Thrombus within right middle cerebral artery bifurcation/branch vessel. Global atrophy.  MRA HEAD 12/02/2015   Abrupt cut off of flow right middle cerebral artery bifurcation. There is an early branch vessel supplying the anterior temporal lobe which originates proximal to the obstruction otherwise no right middle cerebral artery branches are noted.   Carotid  Doppler   1-39% internal carotid artery stenosis involving visualized segments. The right vertebral artery is patent with antegrade flow. Unable to visualize the left vertebral artery due to technical difficulties.   PHYSICAL EXAM Frail elderly patient who is not intubated but somnolent,  . Afebrile. Head is nontraumatic. Neck is supple without bruit. Cardiac exam no murmur or gallop. Lungs are clear to auscultation. Distal pulses are well felt. Neurological Exam  : stuporose snoring. Does not open eyes to sternal rub. does not follow commands. She is non verbal. PERRL. Right gaze, preference. does not track. Unable to assess visual acuity, attempted. Left facial weakness.  Tongue midline. Cough and gag intact. Purposeful right arm movements.Moves right leg spontaneously.  No purposeful movements on the left.  Postures on the left upper extremity with noxious stimuli. Withdraws on the other extremities with noxious stimuli.toes upgoing.  Gait: Unable to test      ASSESSMENT/PLAN [name] is a 82 y.o. female with history of hypertension, hyperlipidemia, and coronary artery disease presenting with decreased responsiveness, left hemiparesis, and aphasia. She received IV t-PA at [facility] 11/30/2105 at 1448.  Stroke:  Non-dominant large R MCA infarct, embolic secondary to new onset atrial fibrillation   Resultant  Left hemiparesis, dysphagia, global aphasia, eye opening apraxia, neurologic neglect  CT perfusion no salvageable penumbra  MRI - large R MCA infarct  MRA - R MCA cutoff  Carotid Doppler - no ICA stenosis  2D Echo - pending  LDL - 83  HgbA1c pending  VTE prophylaxis - SCDs Diet NPO time specified  No antithrombotic prior to admission, now on aspirin 300 mg suppository daily. Change to per tube as panda to be placed  Ongoing aggressive stroke risk factor management  Patient is a DNR/DNI  Therapy recommendations:  Pending ( patient on bedrest, ok to be OOB in am )  Disposition: Pending  Keep in ICU today. Consider floor transfer in am or when off cardizem drip  Atrial Fibrillation with RVR, new dx  On cardizem drip  Rate currently 96  Cardiology consulted to assist with AF management  CHA2DS2-VASc Score = 7, ?2 oral anticoagulation recommended  Age in Years:  ?9   +2    Sex:  Female   Female   +1    Hypertension History:  yes   +1     Diabetes Mellitus:  0   Congestive Heart Failure History:   0  Vascular Disease History:  yes   +1     Stroke/TIA/Thromboembolism History:  yes   +2  Consider anticoagulation in the future, depending on plan of care. Stroke too large for anticoagulation / high bleeding risk at this time  Dysphagia  Secondary to stroke  Place panda  Start tube feedings  Resume home meds via panda  Hypertension  Stable   Resume home meds  Hyperlipidemia  Home meds: No lipid lowering medications prior to admission  LDL 83, goal < 70  Resume Statin once tube placed  Other Stroke Risk Factors  Advanced age  Coronary artery disease  Family history of stroke (mother)  Other Active Problems  Leukocytosis, WBC 12.0. Pt found down. [facility] day # 2  I have personally examined this patient, reviewed notes, independently viewed imaging studies, participated in medical decision making and plan of care. I have made any additions or clarifications directly to the above note. Agree with note above. She has presented with a large right MCA infarct and despite receiving  IV tPA has not shown significant neurological improvement. She remains at risk for recurrent stroke, development of cerebral edema, neurological worsening, recurrent stroke and, TIA and prognosis is quite guarded. Had a long discussion with the family at the bedside about this and answered questions. The family is quite clear about DO NOT RESUSCITATE but would like to continue supportive care for now. Check swallow eval in a.m. and if fails may need panda tube feeding This patient is critically ill and at significant risk of neurological worsening, death and care requires constant monitoring of vital signs, hemodynamics,respiratory and cardiac monitoring, extensive review of multiple databases, frequent neurological assessment, discussion with family, other specialists and medical decision making of high complexity.I have made any additions or clarifications directly to the above  note.This critical care time does not reflect procedure time, or teaching time or supervisory time of PA/NP/Med Resident etc but could involve care discussion time.  I spent 30 minutes of neurocritical care time  in the care of  this patient.     Antony Contras, MD Medical Director [facility] Pager: [phone] 12/03/2015 3:14 PM    To contact Stroke Continuity provider, please refer to [email or web address] After hours, contact General Neurology

## 2015-12-03 NOTE — Progress Notes (Signed)
SLP Cancellation Note  Patient Details Name: Evelyn Patterson MRN: [id number] DOB: 1929-02-22   Cancelled treatment:       Reason Eval/Treat Not Completed: Fatigue/lethargy limiting ability to participate  Gabriel Rainwater [place], [place] [phone]  Kimberely Mccannon Meryl 12/03/2015, 10:20 AM

## 2015-12-03 NOTE — Progress Notes (Signed)
OT Cancellation Note  Patient Details Name: Evelyn Patterson MRN: [id number] DOB: May 07, 1929   Cancelled Treatment:    Reason Eval/Treat Not Completed: Patient not medically ready. Pt still on bedrest, will need increased activity orders to evaluate pt.  Almon Register [id number] 12/03/2015, 8:14 AM

## 2015-12-03 NOTE — Consult Note (Signed)
Patient ID: Evelyn Patterson MRN: [id number], DOB/AGE: 07-19-1929   Admit date: 12/01/2015   Primary Physician: No primary care provider on file. Primary Cardiologist: New (Dr. Ellyn Hack)  Pt. Profile:  82 y/o female with h/o HTN and HLD, but no prior cardiac history admitted 1/211/17 with large nonhemorrhagic right middle cerebral artery distribution infarct, felt to be embolic in the setting of new onset atrial fibrillation.  Problem List  Past Medical History  Diagnosis Date  . Hypertension   . Coronary artery disease     Past Surgical History  Procedure Laterality Date  . Abdominal hysterectomy       Allergies  No Known Allergies  HPI  82 y/o female with h/o HTN and HLD, but no prior cardiac history admitted 1/211/17 with large nonhemorrhagic right middle cerebral artery distribution infarct, felt to be embolic in the setting of new onset atrial fibrillation. Carotid dopplers showed no ICA stenosis. 2D echo 12/03/15 showed normal LVEF of 55-60% with normal wall motion.   Home Medications  Prior to Admission medications   Medication Sig Start Date End Date Taking? Authorizing Provider  aspirin EC 81 MG tablet Take 81 mg by mouth every other day.   Yes Historical Provider, MD  losartan (COZAAR) 50 MG tablet Take 50 mg by mouth daily.   Yes Historical Provider, MD  metoprolol tartrate (LOPRESSOR) 25 MG tablet Take 12.5 mg by mouth 2 (two) times daily.   Yes Historical Provider, MD  Multiple Vitamin (MULTIVITAMIN WITH MINERALS) TABS tablet Take 1 tablet by mouth daily.   Yes Historical Provider, MD  omega-3 acid ethyl esters (LOVAZA) 1 g capsule Take by mouth 2 (two) times daily.   Yes Historical Provider, MD  simvastatin (ZOCOR) 40 MG tablet Take 40 mg by mouth daily.   Yes Historical Provider, MD  traMADol-acetaminophen (ULTRACET) 37.5-325 MG tablet Take 1 tablet by mouth every 6 (six) hours as needed.   Yes Historical Provider, MD  trimethoprim (TRIMPEX) 100 MG tablet Take  100 mg by mouth 2 (two) times daily as needed. For uti   Yes Historical Provider, MD    Family History  Family History  Problem Relation Age of Onset  . Hypertension Mother   . Stroke Mother   . Cancer Father     lung  . Hypertension Son   . Cancer Son     Social History  Social History   Social History  . Marital Status: Widowed    Spouse Name: N/A  . Number of Children: N/A  . Years of Education: N/A   Occupational History  . Not on file.   Social History Main Topics  . Smoking status: Not on file  . Smokeless tobacco: Not on file  . Alcohol Use: Not on file  . Drug Use: Not on file  . Sexual Activity: Not on file   Other Topics Concern  . Not on file   Social History Narrative     Review of Systems General:  No chills, fever, night sweats or weight changes.  Cardiovascular:  No chest pain, dyspnea on exertion, edema, orthopnea, palpitations, paroxysmal nocturnal dyspnea. Dermatological: No rash, lesions/masses Respiratory: No cough, dyspnea Urologic: No hematuria, dysuria Abdominal:   No nausea, vomiting, diarrhea, bright red blood per rectum, melena, or hematemesis Neurologic:  No visual changes, wkns, changes in mental status. All other systems reviewed and are otherwise negative except as noted above.  Physical Exam  Blood pressure 133/92, pulse 120, temperature 97.6 F (36.4 C),  temperature source Axillary, resp. rate 21, height 5\' 1"  (1.549 m), weight 139 lb 15.9 oz (63.5 kg), SpO2 96 %.  General: s/p stroke, not following commands, nonverbal with NTG   Psych: Normal affect. Neuro: Alert and oriented X 3. Moves all extremities spontaneously. HEENT: Normal  Neck: Supple without bruits or JVD. Lungs:  Resp regular and unlabored, CTA. Heart: irregularly irregular, tachy rate  Abdomen: Soft, non-tender, non-distended, BS + x 4.  Extremities: No clubbing, cyanosis or edema. DP/PT/Radials 2+ and equal bilaterally.  Labs  Troponin (Point of Care  Test) No results for input(s): TROPIPOC in the last 72 hours. No results for input(s): CKTOTAL, CKMB, TROPONINI in the last 72 hours. Lab Results  Component Value Date   WBC 12.0* 12/03/2015   HGB 14.6 12/03/2015   HCT 42.7 12/03/2015   MCV 94.9 12/03/2015   PLT 190 12/03/2015     Recent Labs Lab 12/03/15 0334  NA 142  K 4.1  CL 111  CO2 20*  BUN 10  CREATININE 0.91  CALCIUM 9.0  GLUCOSE 102*   Lab Results  Component Value Date   CHOL 148 12/02/2015   HDL 52 12/02/2015   LDLCALC 83 12/02/2015   TRIG 67 12/02/2015   No results found for: DDIMER   Radiology/Studies  Mr Brain Wo Contrast  12/02/2015  CLINICAL DATA:  82 year old female with hypertension and left-sided weakness with altered mental status. Subsequent encounter. EXAM: MRI HEAD WITHOUT CONTRAST MRA HEAD WITHOUT CONTRAST TECHNIQUE: Multiplanar, multiecho pulse sequences of the brain and surrounding structures were obtained without intravenous contrast. Angiographic images of the head were obtained using MRA technique without contrast. COMPARISON:  12/02/2015 CT profusion study and head CT and CT angiogram. FINDINGS: MRI HEAD FINDINGS Large right middle cerebral artery distribution infarct involving right temporal lobe, right operculum region, right subinsular region, right frontal lobe and right parietal lobe with mild mass effect upon the right lateral ventricle without midline shift. No associated hemorrhage. Thrombus within right middle cerebral artery bifurcation/branch vessel. No intracranial mass lesion noted on this unenhanced exam. Global atrophy. Cervical medullary junction, pituitary region, pineal region and orbital structures unremarkable. MRA HEAD FINDINGS Abrupt cut off of flow right middle cerebral artery bifurcation. There is an early branch vessel supplying the anterior temporal lobe which originates proximal to the obstruction otherwise no right middle cerebral artery branches are noted. Ectatic internal  carotid arteries without high-grade stenosis. No significant stenosis carotid terminus. No significant stenosis left middle cerebral artery proximal branches or anterior cerebral artery on either side. Ectatic vertebral arteries and basilar artery without significant stenosis. Nonvisualized anterior inferior cerebellar arteries. Mild narrowing distal left posterior cerebral artery branch vessel. IMPRESSION: MRI HEAD Large right middle cerebral artery distribution infarct with mild mass effect upon the right lateral ventricle without midline shift. No associated hemorrhage. Thrombus within right middle cerebral artery bifurcation/branch vessel. Global atrophy. MRA HEAD Abrupt cut off of flow right middle cerebral artery bifurcation. There is an early branch vessel supplying the anterior temporal lobe which originates proximal to the obstruction otherwise no right middle cerebral artery branches are noted. Electronically Signed   By: Genia Del M.D.   On: 12/02/2015 15:58   Ct Cerebral Perfusion W/cm  12/01/2015  CLINICAL DATA:  82 year old female with left-sided weakness. Subsequent encounter. EXAM: CT CEREBRAL PERFUSION WITH CONTRAST TECHNIQUE: Perfusion maps obtained. CONTRAST:  24mL OMNIPAQUE IOHEXOL 350 MG/ML SOLN COMPARISON:  CT angiogram 12/01/2015. FINDINGS: Large nonhemorrhagic right middle cerebral artery distribution infarct. Small surrounding ischemic penumbra.  Patient at risk for development of hemorrhage and mass effect given the size of the infarct. IMPRESSION: Large nonhemorrhagic right middle cerebral artery distribution infarct. Small surrounding ischemic penumbra. Patient at risk for development of hemorrhage and mass effect given the size of the infarct. These results were called by telephone at the time of interpretation on 12/01/2015 at 6:26 pm to Dr. Dorian Pod , who verbally acknowledged these results. Electronically Signed   By: Genia Del M.D.   On: 12/01/2015 18:59   Dg Chest  Port 1 View  12/03/2015  CLINICAL DATA:  82 year old hospitalized for a large right middle cerebral artery distribution stroke 2 days ago, now with leukocytosis. Possible aspiration. EXAM: PORTABLE CHEST 1 VIEW COMPARISON:  12/01/2015, 07/28/2006, 01/20/2005. FINDINGS: Markedly suboptimal inspiration. Worsened aeration at the lung bases since the examination 2 days ago. More confluent airspace consolidation in the left lower lobe silhouetting the left hemidiaphragm. Cardiac silhouette upper normal in size for technique and degree of inspiration. Mild pulmonary venous hypertension, increased since the examination 2 days ago, without evidence of overt pulmonary edema. No pneumothorax. Feeding tube courses below the diaphragm into the stomach though its tip is not included on the image. IMPRESSION: 1. Likely acute pneumonia involving the left lower lobe. 2. Markedly suboptimal inspiration accounts for worsening atelectasis at the lung bases. 3. Mild pulmonary venous hypertension, increased since 2 days ago, without overt edema. Query incipient fluid overload. Electronically Signed   By: Evangeline Dakin M.D.   On: 12/03/2015 14:38   Dg Abd Portable 1v  12/03/2015  CLINICAL DATA:  Feeding tube placement EXAM: PORTABLE ABDOMEN - 1 VIEW COMPARISON:  07/29/2006 FINDINGS: Feeding tube has been placed with the tip in the descending portion of the duodenum. Nonobstructive bowel gas pattern. IMPRESSION: Feeding tube tip in the second portion of the duodenum. Electronically Signed   By: Rolm Baptise M.D.   On: 12/03/2015 11:35   Mr Jodene Nam Head/brain Wo Cm  12/02/2015  CLINICAL DATA:  82 year old female with hypertension and left-sided weakness with altered mental status. Subsequent encounter. EXAM: MRI HEAD WITHOUT CONTRAST MRA HEAD WITHOUT CONTRAST TECHNIQUE: Multiplanar, multiecho pulse sequences of the brain and surrounding structures were obtained without intravenous contrast. Angiographic images of the head were  obtained using MRA technique without contrast. COMPARISON:  12/02/2015 CT profusion study and head CT and CT angiogram. FINDINGS: MRI HEAD FINDINGS Large right middle cerebral artery distribution infarct involving right temporal lobe, right operculum region, right subinsular region, right frontal lobe and right parietal lobe with mild mass effect upon the right lateral ventricle without midline shift. No associated hemorrhage. Thrombus within right middle cerebral artery bifurcation/branch vessel. No intracranial mass lesion noted on this unenhanced exam. Global atrophy. Cervical medullary junction, pituitary region, pineal region and orbital structures unremarkable. MRA HEAD FINDINGS Abrupt cut off of flow right middle cerebral artery bifurcation. There is an early branch vessel supplying the anterior temporal lobe which originates proximal to the obstruction otherwise no right middle cerebral artery branches are noted. Ectatic internal carotid arteries without high-grade stenosis. No significant stenosis carotid terminus. No significant stenosis left middle cerebral artery proximal branches or anterior cerebral artery on either side. Ectatic vertebral arteries and basilar artery without significant stenosis. Nonvisualized anterior inferior cerebellar arteries. Mild narrowing distal left posterior cerebral artery branch vessel. IMPRESSION: MRI HEAD Large right middle cerebral artery distribution infarct with mild mass effect upon the right lateral ventricle without midline shift. No associated hemorrhage. Thrombus within right middle cerebral artery bifurcation/branch vessel.  Global atrophy. MRA HEAD Abrupt cut off of flow right middle cerebral artery bifurcation. There is an early branch vessel supplying the anterior temporal lobe which originates proximal to the obstruction otherwise no right middle cerebral artery branches are noted. Electronically Signed   By: Genia Del M.D.   On: 12/02/2015 15:58     ECG  Atrial fibrillation   Echocardiogram 12/03/15  Study Conclusions  - Left ventricle: The cavity size was normal. Posterior wall thickness was increased in a pattern of mild LVH and septal wall thickness was moderately hypertophied. Systolic function was normal. The estimated ejection fraction was in the range of 55% to 60%. Wall motion was normal; there were no regional wall motion abnormalities. - Aortic valve: There was mild regurgitation. - Mitral valve: There was mild regurgitation. - Right ventricle: The cavity size was normal. Wall thickness was normal. Systolic function was normal. - Tricuspid valve: There was trivial regurgitation. - Pulmonary arteries: Systolic pressure was within the normal range. - Inferior vena cava: The vessel was normal in size. The respirophasic diameter changes were in the normal range (>= 50%), consistent with normal central venous pressure.     ASSESSMENT AND PLAN  Active Problems:   Stroke with cerebral ischemia (HCC)   Atrial fibrillation ([place])   1. Stroke: Non-dominant large R MCA infarct, felt to be embolic secondary to new onset atrial fibrillation. management per Neuro.     2. Atrial Fibrillation: HR currently in the low 100s. Continue IV Cardizem and metoprolol per tube for rate control. She is currently with NGT due to dysphagia from her stroke. EF is normal per Echo at 55-60%. Can transition to PO Cardizem once able to tolerate PO. If able to take PO tomorrow, will transition Cardizem to 60 mg Q6H to see how she tolerates. Continue PO metoprolol per tube until able to take by mouth. Given large CVA in the setting of atrial fibrillation, we will defer timing of anticoagulation therapy decision to neuro, as the patient is at risk for development of hemorrhage and mass effect given the size of the infarct.   Signed, Lyda Jester, PA-C 12/03/2015, 3:26 PM   I saw and evaluated the patient today along  with Ellen Henri, PA-C. Very unfortunate elderly woman who is suffered a large right MCA stroke thought be a thromboembolic probably from atrial fibrillation. She remains each fibrillation with borderline blood pressures and borderline rate control. On my evaluation, she actually was well controlled and sleeping. She did not wait for me. Diffusely bruised.  I agree with the concept of transitioning from IV diltiazem to by mouth. Judging by the current rate, this would be roughly 60 mg every 6. Low-dose beta blockers also been started. For now would opt for rate control is posted attempted rhythm control. She does not seem to be symptomatic.  Echogram was performed -- will await results.  With large stroke in the setting of A. fib, will defer to neurology as determining the timing for consideration of anticoagulation. No doubt there is high-risk for hemorrhagic conversion given the size/extent of CVA. -- Would avoid inhalation for now. She is on high-dose aspirin and I suspect this is mostly would do now. CHADS2VASC2 is at least 6-7  Unfortunately, she remains relatively unresponsive, not following commands. Does not awake to verbal stimuli. Does not move much to tactile stimuli   Provider heart rate remains stable tomorrow, would switch to oral diltiazem. This would allow transfer out of the ICU.  She is now  DNR/DNI, questionable recovery   Jadis Pitter, Leonie Green, M.D., M.S. Interventional Cardiologist   Pager # [phone] Phone # [phone] [address] [place], [place] [address]

## 2015-12-04 LAB — GLUCOSE, CAPILLARY
GLUCOSE-CAPILLARY: 113 mg/dL — AB (ref 65–99)
Glucose-Capillary: 168 mg/dL — ABNORMAL HIGH (ref 65–99)
Glucose-Capillary: 149 mg/dL — ABNORMAL HIGH (ref 65–99)

## 2015-12-04 MED ORDER — CHLORHEXIDINE GLUCONATE 0.12 % MT SOLN: 15.0000 mL | Freq: Two times a day (BID) | OROMUCOSAL | Status: DC

## 2015-12-04 MED ORDER — CETYLPYRIDINIUM CHLORIDE 0.05 % MT LIQD
7.0000 mL | Freq: Four times a day (QID) | OROMUCOSAL | Status: DC
  Administered 2015-12-04: 7 mL via OROMUCOSAL

## 2015-12-04 MED ORDER — CHLORHEXIDINE GLUCONATE 0.12 % MT SOLN
15.0000 mL | Freq: Two times a day (BID) | OROMUCOSAL | Status: DC
  Filled 2015-12-04: qty 15

## 2015-12-04 MED ORDER — SODIUM CHLORIDE 0.9 % IV SOLN
1.0000 mg/h | INTRAVENOUS | Status: DC
  Administered 2015-12-04: 1 mg/h via INTRAVENOUS
  Administered 2015-12-05: 10 mg/h via INTRAVENOUS
  Filled 2015-12-04 (×2): qty 10

## 2015-12-04 NOTE — Progress Notes (Signed)
STROKE TEAM PROGRESS NOTE   SUBJECTIVE (INTERVAL HISTORY) Her Aunt is her official POA, but she is deferring decisions to her grandson. Overnight pt with increased work of breathing, aspiration. Now with temp. Would benefit from intubation, depending on family decision.    OBJECTIVE Temp:  [97.6 F (36.4 C)-100.3 F (37.9 C)] 100.3 F (37.9 C) (01/24 0839) Pulse Rate:  [34-146] 119 (01/24 0700) Cardiac Rhythm:  [-] Atrial fibrillation (01/24 0400) Resp:  [15-36] 30 (01/24 0700) BP: (85-144)/(44-100) 113/73 mmHg (01/24 0700) SpO2:  [87 %-100 %] 98 % (01/24 0700) Weight:  [66.3 kg (146 lb 2.6 oz)] 66.3 kg (146 lb 2.6 oz) (01/24 0500)  CBC:   Recent Labs Lab 12/03/15 0334  WBC 12.0*  HGB 14.6  HCT 42.7  MCV 94.9  PLT 99991111    Basic Metabolic Panel:   Recent Labs Lab 12/03/15 0334  NA 142  K 4.1  CL 111  CO2 20*  GLUCOSE 102*  BUN 10  CREATININE 0.91  CALCIUM 9.0    Lipid Panel:     Component Value Date/Time   CHOL 148 12/02/2015 0423   TRIG 67 12/02/2015 0423   HDL 52 12/02/2015 0423   CHOLHDL 2.8 12/02/2015 0423   VLDL 13 12/02/2015 0423   LDLCALC 83 12/02/2015 0423   HgbA1c:  Lab Results  Component Value Date   HGBA1C 6.0* 12/02/2015   Urine Drug Screen: No results found for: LABOPIA, COCAINSCRNUR, LABBENZ, AMPHETMU, THCU, LABBARB    IMAGING  Ct Cerebral Perfusion W/cm 12/01/2015   Large nonhemorrhagic right middle cerebral artery distribution infarct. Small surrounding ischemic penumbra. Patient at risk for development of hemorrhage and mass effect given the size of the infarct.   MRI HEAD  12/02/2015  Large right middle cerebral artery distribution infarct with mild mass effect upon the right lateral ventricle without midline shift. No associated hemorrhage. Thrombus within right middle cerebral artery bifurcation/branch vessel. Global atrophy.  MRA HEAD 12/02/2015   Abrupt cut off of flow right middle cerebral artery bifurcation. There is an early  branch vessel supplying the anterior temporal lobe which originates proximal to the obstruction otherwise no right middle cerebral artery branches are noted.   Carotid Doppler   1-39% internal carotid artery stenosis involving visualized segments. The right vertebral artery is patent with antegrade flow. Unable to visualize the left vertebral artery due to technical difficulties.  CXR 12/03/2015 1. Likely acute pneumonia involving the left lower lobe. 2. Markedly suboptimal inspiration accounts for worsening atelectasis at the lung bases. 3. Mild pulmonary venous hypertension, increased since 2 days ago, without overt edema. Query incipient fluid overload.  ABD Xray 12/03/2015 Feeding tube tip in the second portion of the duodenum.  2D Echocardiogram  - Left ventricle: The cavity size was normal. Posterior wall thickness was increased in a pattern of mild LVH and septal wallthickness was moderately hypertophied. Systolic function wasnormal. The estimated ejection fraction was in the range of 55%to 60%. Wall motion was normal; there were no regional wallmotion abnormalities. - Aortic valve: There was mild regurgitation. - Mitral valve: There was mild regurgitation. - Right ventricle: The cavity size was normal. Wall thickness wasnormal. Systolic function was normal. - Tricuspid valve: There was trivial regurgitation. - Pulmonary arteries: Systolic pressure was within the normal range. - Inferior vena cava: The vessel was normal in size. The respirophasic diameter changes were in the normal range (>= 50%),consistent with normal central venous pressure.  Carotid Doppler   Findings suggest 1-39% internal carotid artery stenosis  involving visualized segments. The right vertebral artery is patent with antegrade flow. Unable to visualize the left vertebral artery due to technical difficulties.   PHYSICAL EXAM Frail elderly patient who is not intubated but somnolent and in marked resp distress,   . Afebrile. Head is nontraumatic. Neck is supple without bruit. Cardiac exam no murmur or gallop. Lungs are clear to auscultation. Distal pulses are well felt. Neurological Exam : stuporose snoring. Does not open eyes to sternal rub. does not follow commands. She is non verbal. PERRL. Right gaze, preference. does not track. Unable to assess visual acuity, attempted. Left facial weakness.  Tongue midline. Cough and gag intact. Purposeful right arm movements.Moves right leg spontaneously.  No purposeful movements on the left.  Postures on the left upper extremity with noxious stimuli. Withdraws on the other extremities with noxious stimuli.toes upgoing.  Gait: Unable to test      ASSESSMENT/PLAN [name] is a 82 y.o. female with history of hypertension, hyperlipidemia, and coronary artery disease presenting with decreased responsiveness, left hemiparesis, and aphasia. She received IV t-PA at [facility] 11/30/2105 at 1448.  Stroke:  Non-dominant large R MCA infarct, embolic secondary to new onset atrial fibrillation   Resultant  Left hemiparesis, dysphagia, global aphasia, eye opening apraxia, neurologic neglect  CT perfusion no salvageable penumbra  MRI - large R MCA infarct  MRA - R MCA cutoff  Carotid Doppler - no ICA stenosis  2D Echo - No source of embolus   LDL - 83  HgbA1c 6.0  VTE prophylaxis - SCDs Diet NPO time specified  No antithrombotic prior to admission, now on aspirin 325 mg daily per tube   Ongoing aggressive stroke risk factor management  Patient is a DNR/DNI  Therapy recommendations:  Pending. Continue bedrest given poor respiratory drive  Disposition: Pending  Continue ICU level care for now. Have called family to discuss plan -> after discussion Dr. Leonie Man and her niece, pt has been made full comfort care. Will stop treatment, add morphine drip, transfer to the floor.  Atrial Fibrillation with RVR, new dx  Remains On cardizem  drip  Rate currently 95  Cardiology consulted to assist with AF management. Recommend transistion from IV to po (60 q 6)  CHA2DS2-VASc Score = 7, ?2 oral anticoagulation recommended  Age in Years:  ?73   +2    Sex:  Female   Female   +1    Hypertension History:  yes   +1     Diabetes Mellitus:  0   Congestive Heart Failure History:  0  Vascular Disease History:  yes   +1     Stroke/TIA/Thromboembolism History:  yes   +2  Consider anticoagulation in the future, depending on plan of care. Stroke too large for anticoagulation / high bleeding risk at this time  Aspiration PNA  Pt found down.   PNA seen on CXR  WBC 12.0  Temp Max 100.3 ax   Respiratory Distress  Pt DNR/DNI  Need to address plan with pt's family  Dysphagia  Secondary to stroke  Placed panda 1/23, Started tube feedings  Hypertension  Stable   Resumed home meds  Hyperlipidemia  Home meds: No lipid lowering medications prior to admission  LDL 83, goal < 70  Resume Statin once tube placed  Other Stroke Risk Factors  Advanced age  Coronary artery disease  Family history of stroke (mother)  Hospital day # [place] for Pager  information 12/04/2015 9:25 AM  I have personally examined this patient, reviewed notes, independently viewed imaging studies, participated in medical decision making and plan of care. I have made any additions or clarifications directly to the above note. Agree with note above.. Patient's condition has significantly declined and she likely has aspiration pneumonia and is in marked respiratory distress and uncomfortable. I had a long discussion with the patient's sister over the phone and she agreed with making her DO NOT RESUSCITATE and comfort care. I recommend transferring to hospice and starting morphine drip for patient comfort and stopping everything else. The family is in agreement This patient is critically ill and at  significant risk of neurological worsening, death and care requires constant monitoring of vital signs, hemodynamics,respiratory and cardiac monitoring, extensive review of multiple databases, frequent neurological assessment, discussion with family, other specialists and medical decision making of high complexity.I have made any additions or clarifications directly to the above note.This critical care time does not reflect procedure time, or teaching time or supervisory time of PA/NP/Med Resident etc but could involve care discussion time.  I spent 30 minutes of neurocritical care time  in the care of  this patient.     Antony Contras, MD Medical Director [facility] Pager: [phone] 12/04/2015 3:28 PM  To contact Stroke Continuity provider, please refer to [email or web address] After hours, contact General Neurology

## 2015-12-04 NOTE — Care Management Important Message (Signed)
Important Message  Patient Details  Name: Evelyn Patterson MRN: [id number] Date of Birth: 17-Apr-1929   Medicare Important Message Given:  Yes    [name] 12/04/2015, 1:47 PM

## 2015-12-04 NOTE — Progress Notes (Signed)
SLP Cancellation Note  Patient Details Name: Evelyn Patterson MRN: [id number] DOB: [birth year]   Cancelled treatment:       Reason Eval/Treat Not Completed: Fatigue/lethargy limiting ability to participate. Persistently lethargic per RN. Will sign off at this time. Please reorder when mentation improves for swallow eval.    Kirti Carl, Katherene Ponto 12/04/2015, 8:26 AM

## 2015-12-04 NOTE — Progress Notes (Signed)
Nutrition Brief Note  Chart reviewed. Pt now transitioning to comfort care.  No further nutrition interventions warranted at this time.  Please re-consult as needed.   Jocie Meroney RD, LDN, CNSC [phone] Pager [phone] After Hours Pager    

## 2015-12-05 ENCOUNTER — Encounter (HOSPITAL_COMMUNITY): Payer: Self-pay | Admitting: *Deleted

## 2015-12-05 LAB — GLUCOSE, CAPILLARY
GLUCOSE-CAPILLARY: 120 mg/dL — AB (ref 65–99)
GLUCOSE-CAPILLARY: 121 mg/dL — AB (ref 65–99)

## 2015-12-05 MED ORDER — MORPHINE SULFATE 25 MG/ML IV SOLN: 1.0000 mg/h | INTRAVENOUS | Status: AC

## 2015-12-05 MED ORDER — SODIUM CHLORIDE 0.9 % IV SOLN: 1.0000 mg/h | INTRAVENOUS | Status: AC

## 2015-12-05 NOTE — Discharge Summary (Signed)
Physician Discharge Summary  Patient ID: Evelyn Patterson MRN: [id number] DOB/AGE: 08-04-1929 62 y.o.  Admit date: 12/01/2015 Discharge date: 12/05/2015  Admission Diagnoses: left hemiparesis, altered mental status,     Discharge Diagnoses: Large right middle cerebral artery infarct secondary to new onset atrial fibrillation with left hemiparesis and decreased responsiveness treated with IV TPA but without significant clinical improvement. Patient made DO NOT RESUSCITATE and comfort care by the family. Aspiration pneumonia Active Problems:   Stroke with cerebral ischemia [facility])   Atrial fibrillation (HCC) Aspiration pneumonia  Discharged Condition: comfort care  Hospital Course:  Evelyn Patterson is an 82 y.o. female with a past medical history significant for HTN and HLD, evaluated at [facility] ED this morning due to the above stated symptoms. Conflicting information about patient last known well: as per [facility] ED patient was last known well at 11: 30 this morning. However, her son is at the bedside and said that he last saw her normal at 5 pm yesterday and today he went to go check on her at 51 and had to break down door because pt was not answering. He found patient on the floor, responsive but with less movements in the left side. Patient was taken to [facility] ED where she had CT brain that was reportedly negative for acute abnormality, but CTA with abrupt cuff off right MCA bifurcation. She was given iv tPA and [facility] ED contacted interventional neuroradiologist and patient was transferred to [facility] for further evaluation and likely endovascular intervention. Presently, patient is restless, does not speak or follow commands, and has decreased motility left arm. As per son, patient was given some sedation at [facility] ED  Date last known well: 12/01/15 Time last known well: 5 pm tPA Given: yes NIHSS: 20 Patient's neurological condition remained poor with decreased responsiveness and  dense left hemiplegia. MRI scan of the brain confirmed a large MCA infarct. Patient was unable to swallow or eat. She developed respiratory distress and likely aspiration pneumonia. After discussion with the family they agreed to her poor prognosis and wanted DO NOT RESUSCITATE and subsequently comfort care. She was started on morphine drip for comfort. Family preferred to move the patient to hospice nnursing home as her expected survival was felt to be less than 2 weeks. Consults: cardiology Dr Ellyn Hack  Significant Diagnostic Studies:   CBC:   Last Labs      Recent Labs Lab 12/03/15 0334  WBC 12.0*  HGB 14.6  HCT 42.7  MCV 94.9  PLT 190      Basic Metabolic Panel:   Last Labs      Recent Labs Lab 12/03/15 0334  NA 142  K 4.1  CL 111  CO2 20*  GLUCOSE 102*  BUN 10  CREATININE 0.91  CALCIUM 9.0      Lipid Panel:   Labs (Brief)       Component Value Date/Time   CHOL 148 12/02/2015 0423   TRIG 67 12/02/2015 0423   HDL 52 12/02/2015 0423   CHOLHDL 2.8 12/02/2015 0423   VLDL 13 12/02/2015 0423   LDLCALC 83 12/02/2015 0423     HgbA1c:   Recent Labs    Lab Results  Component Value Date   HGBA1C 6.0* 12/02/2015     Urine Drug Screen:    Labs (Brief)    No results found for: LABOPIA, COCAINSCRNUR, LABBENZ, AMPHETMU, THCU, LABBARB      IMAGING  Ct Cerebral Perfusion W/cm 12/01/2015  Large nonhemorrhagic right middle cerebral  artery distribution infarct. Small surrounding ischemic penumbra. Patient at risk for development of hemorrhage and mass effect given the size of the infarct.   MRI HEAD  12/02/2015 Large right middle cerebral artery distribution infarct with mild mass effect upon the right lateral ventricle without midline shift. No associated hemorrhage. Thrombus within right middle cerebral artery bifurcation/branch vessel. Global atrophy.  MRA HEAD 12/02/2015 Abrupt cut off of flow  right middle cerebral artery bifurcation. There is an early branch vessel supplying the anterior temporal lobe which originates proximal to the obstruction otherwise no right middle cerebral artery branches are noted.  Carotid Doppler  1-39% internal carotid artery stenosis involving visualized segments. The right vertebral artery is patent with antegrade flow. Unable to visualize the left vertebral artery due to technical difficulties.  CXR 12/03/2015 1. Likely acute pneumonia involving the left lower lobe. 2. Markedly suboptimal inspiration accounts for worsening atelectasis at the lung bases. 3. Mild pulmonary venous hypertension, increased since 2 days ago, without overt edema. Query incipient fluid overload.  ABD Xray 12/03/2015 Feeding tube tip in the second portion of the duodenum.  2D Echocardiogram  - Left ventricle: The cavity size was normal. Posterior wall thickness was increased in a pattern of mild LVH and septal wallthickness was moderately hypertophied. Systolic function wasnormal. The estimated ejection fraction was in the range of 55%to 60%. Wall motion was normal; there were no regional wallmotion abnormalities. - Aortic valve: There was mild regurgitation. - Mitral valve: There was mild regurgitation. - Right ventricle: The cavity size was normal. Wall thickness wasnormal. Systolic function was normal. - Tricuspid valve: There was trivial regurgitation. - Pulmonary arteries: Systolic pressure was within the normal range. - Inferior vena cava: The vessel was normal in size. The respirophasic diameter changes were in the normal range (>= 50%),consistent with normal central venous pressure.  Carotid Doppler  Findings suggest 1-39% internal carotid artery stenosis involving visualized segments. The right vertebral artery is patent with antegrade flow. Unable to visualize the left vertebral artery due to technical difficulties.       Treatments:iv TPA at [facility] Discharge Exam: Blood pressure 109/67, pulse 125, temperature 97.6 F (36.4 C), temperature source Axillary, resp. rate 23, height 5\' 1"  (1.549 m), weight 146 lb 2.6 oz (66.3 kg), SpO2 39 %.  PHYSICAL EXAM Frail elderly patient who is sedated on morphine drip . Afebrile. Head is nontraumatic. Neck is supple without bruit. Cardiac exam no murmur or gallop. Lungs are clear to auscultation. Distal pulses are well felt. Neurological Exam : deeply sedated Does not open eyes to sternal rub. does not follow commands. She is non verbal. PERRL. Right gaze, preference. does not track. Unable to assess visual acuity, attempted. Left facial weakness. Tongue midline. Cough and gag intact. Purposeful right arm movements.Moves right leg spontaneously. No purposeful movements on the left. Postures on the left upper extremity with noxious stimuli. Withdraws on the other extremities with noxious stimuli.toes upgoing.  Gait: Unable to test  Disposition: Hospice nursing home     Medication List    STOP taking these medications        aspirin EC 81 MG tablet     losartan 50 MG tablet  Commonly known as:  COZAAR     metoprolol tartrate 25 MG tablet  Commonly known as:  LOPRESSOR     multivitamin with minerals Tabs tablet     omega-3 acid ethyl esters 1 g capsule  Commonly known as:  LOVAZA     simvastatin 40  MG tablet  Commonly known as:  ZOCOR     traMADol-acetaminophen 37.5-325 MG tablet  Commonly known as:  ULTRACET     trimethoprim 100 MG tablet  Commonly known as:  TRIMPEX      TAKE these medications        morphine 250 mg in sodium chloride 0.9 % 240 mL  Inject 1 mg/hr into the vein continuous.       expected length of survival felt to be less than 2 weeks  total time spent on discharge summary 35 minutes.  Signed: SETHI,PRAMOD 12/05/2015, 3:26 PM

## 2015-12-05 NOTE — Progress Notes (Signed)
Wasted 200 cc of Morphine IV.  Witnessed by Anderson Malta, Shelly Bombard.

## 2015-12-05 NOTE — Progress Notes (Signed)
[facility] arranged on behalf of pt to [facility].  Creta Levin, LCSW Evening/ED Coverage [id number]

## 2015-12-05 NOTE — Clinical Social Work Note (Signed)
Clinical Social Worker received notification from RN that patient family has transitioned patient to comfort care and would like to pursue residential hospice placement at [facility].  CSW initiated referral process and facility extended the bed offer.  Rochelle, completed paperwork with patient family at bedside and notified of late discharge.  RN to call report.  CSW to arrange ambulance transport to [facility] around 7pm this evening.  Clinical Social Worker will sign off for now as social work intervention is no longer needed. Please consult [place] again if new need arises.  Barbette Or, [place]

## 2015-12-05 NOTE — Progress Notes (Signed)
STROKE TEAM PROGRESS NOTE   SUBJECTIVE (INTERVAL HISTORY) Patient was made palliative care yesterday and is resting comfortably on morphine drip. Family is interested in moving patient to palliative care nursing home OBJECTIVE Temp:  [97.3 F (36.3 C)-97.6 F (36.4 C)] 97.6 F (36.4 C) (01/25 1200) Pulse Rate:  [119-125] 125 (01/25 1200) Cardiac Rhythm:  [-] Atrial fibrillation (01/25 0800) Resp:  [18-23] 23 (01/25 1200) SpO2:  [39 %-64 %] 39 % (01/25 1200)  CBC:   Recent Labs Lab 12/03/15 0334  WBC 12.0*  HGB 14.6  HCT 42.7  MCV 94.9  PLT 99991111    Basic Metabolic Panel:   Recent Labs Lab 12/03/15 0334  NA 142  K 4.1  CL 111  CO2 20*  GLUCOSE 102*  BUN 10  CREATININE 0.91  CALCIUM 9.0    Lipid Panel:     Component Value Date/Time   CHOL 148 12/02/2015 0423   TRIG 67 12/02/2015 0423   HDL 52 12/02/2015 0423   CHOLHDL 2.8 12/02/2015 0423   VLDL 13 12/02/2015 0423   LDLCALC 83 12/02/2015 0423   HgbA1c:  Lab Results  Component Value Date   HGBA1C 6.0* 12/02/2015   Urine Drug Screen: No results found for: LABOPIA, COCAINSCRNUR, LABBENZ, AMPHETMU, THCU, LABBARB    IMAGING  Ct Cerebral Perfusion W/cm 12/01/2015   Large nonhemorrhagic right middle cerebral artery distribution infarct. Small surrounding ischemic penumbra. Patient at risk for development of hemorrhage and mass effect given the size of the infarct.   MRI HEAD  12/02/2015  Large right middle cerebral artery distribution infarct with mild mass effect upon the right lateral ventricle without midline shift. No associated hemorrhage. Thrombus within right middle cerebral artery bifurcation/branch vessel. Global atrophy.  MRA HEAD 12/02/2015   Abrupt cut off of flow right middle cerebral artery bifurcation. There is an early branch vessel supplying the anterior temporal lobe which originates proximal to the obstruction otherwise no right middle cerebral artery branches are noted.   Carotid Doppler    1-39% internal carotid artery stenosis involving visualized segments. The right vertebral artery is patent with antegrade flow. Unable to visualize the left vertebral artery due to technical difficulties.  CXR 12/03/2015 1. Likely acute pneumonia involving the left lower lobe. 2. Markedly suboptimal inspiration accounts for worsening atelectasis at the lung bases. 3. Mild pulmonary venous hypertension, increased since 2 days ago, without overt edema. Query incipient fluid overload.  ABD Xray 12/03/2015 Feeding tube tip in the second portion of the duodenum.  2D Echocardiogram  - Left ventricle: The cavity size was normal. Posterior wall thickness was increased in a pattern of mild LVH and septal wallthickness was moderately hypertophied. Systolic function wasnormal. The estimated ejection fraction was in the range of 55%to 60%. Wall motion was normal; there were no regional wallmotion abnormalities. - Aortic valve: There was mild regurgitation. - Mitral valve: There was mild regurgitation. - Right ventricle: The cavity size was normal. Wall thickness wasnormal. Systolic function was normal. - Tricuspid valve: There was trivial regurgitation. - Pulmonary arteries: Systolic pressure was within the normal range. - Inferior vena cava: The vessel was normal in size. The respirophasic diameter changes were in the normal range (>= 50%),consistent with normal central venous pressure.  Carotid Doppler   Findings suggest 1-39% internal carotid artery stenosis involving visualized segments. The right vertebral artery is patent with antegrade flow. Unable to visualize the left vertebral artery due to technical difficulties.   PHYSICAL EXAM Frail elderly patient who is sedated on  morphine drip  . Afebrile. Head is nontraumatic. Neck is supple without bruit. Cardiac exam no murmur or gallop. Lungs are clear to auscultation. Distal pulses are well felt. Neurological Exam : deeply sedated Does not  open eyes to sternal rub. does not follow commands. She is non verbal. PERRL. Right gaze, preference. does not track. Unable to assess visual acuity, attempted. Left facial weakness.  Tongue midline. Cough and gag intact. Purposeful right arm movements.Moves right leg spontaneously.  No purposeful movements on the left.  Postures on the left upper extremity with noxious stimuli. Withdraws on the other extremities with noxious stimuli.toes upgoing.  Gait: Unable to test      ASSESSMENT/PLAN Ms. EVERLY BURDICK is a 82 y.o. female with history of hypertension, hyperlipidemia, and coronary artery disease presenting with decreased responsiveness, left hemiparesis, and aphasia. She received IV t-PA at [facility] 11/30/2105 at 1448.  Stroke:  Non-dominant large R MCA infarct, embolic secondary to new onset atrial fibrillation   Resultant  Left hemiparesis, dysphagia, global aphasia, eye opening apraxia, neurologic neglect  CT perfusion no salvageable penumbra  MRI - large R MCA infarct  MRA - R MCA cutoff  Carotid Doppler - no ICA stenosis  2D Echo - No source of embolus   LDL - 83  HgbA1c 6.0  VTE prophylaxis - SCDs Diet NPO time specified  No antithrombotic prior to admission, now on aspirin 325 mg daily per tube   Ongoing aggressive stroke risk factor management  Patient is a DNR/DNI  Therapy recommendations:  Pending. Continue bedrest given poor respiratory drive  Disposition: Pending  Continue ICU level care for now. Have called family to discuss plan -> after discussion Dr. Leonie Man and her niece, pt has been made full comfort care. Will stop treatment, add morphine drip, transfer to the floor.  Atrial Fibrillation with RVR, new dx  Remains On cardizem drip  Rate currently 95  Cardiology consulted to assist with AF management. Recommend transistion from IV to po (60 q 6)  CHA2DS2-VASc Score = 7, ?2 oral anticoagulation recommended  Age in Years:  ?82   +2    Sex:   Female   Female   +1    Hypertension History:  yes   +1     Diabetes Mellitus:  0   Congestive Heart Failure History:  0  Vascular Disease History:  yes   +1     Stroke/TIA/Thromboembolism History:  yes   +2  Consider anticoagulation in the future, depending on plan of care. Stroke too large for anticoagulation / high bleeding risk at this time  Aspiration PNA  Pt found down.   PNA seen on CXR  WBC 12.0  Temp Max 100.3 ax   Respiratory Distress  Pt DNR/DNI  Need to address plan with pt's family  Dysphagia  Secondary to stroke  Placed panda 1/23, Started tube feedings  Hypertension  Stable   Resumed home meds  Hyperlipidemia  Home meds: No lipid lowering medications prior to admission  LDL 83, goal < 70  Resume Statin once tube placed  Other Stroke Risk Factors  Advanced age  Coronary artery disease  Family history of stroke (mother)  Hospital day # [place] for Pager information 12/05/2015 3:10 PM  I have personally examined this patient, reviewed notes, independently viewed imaging studies, participated in medical decision making and plan of care. I have made any additions or clarifications directly to the above note. Agree  with note above.. Patient's condition   significantly declined  On 1/23/17and she likely developed aspiration pneumonia  . I had a long discussion with the patient's sister over the phone and she agreed with making her DO NOT RESUSCITATE and comfort care. Family is interested in transferring to hospice nursing home  Antony Contras, [place] Pager: [phone] 12/05/2015 3:10 PM  To contact Stroke Continuity provider, please refer to [email or web address] After hours, contact General Neurology

## 2015-12-05 NOTE — Progress Notes (Signed)
Patient DC'd via [facility] to hospice.

## 2015-12-12 DEATH — deceased

## 2016-08-03 IMAGING — CR DG ABD PORTABLE 1V
1 series · 1 of 1 positions shown · non-contrast
Comparison: 07/29/2006

CLINICAL DATA: Feeding tube placement

EXAM:
PORTABLE ABDOMEN - 1 VIEW

[AP]
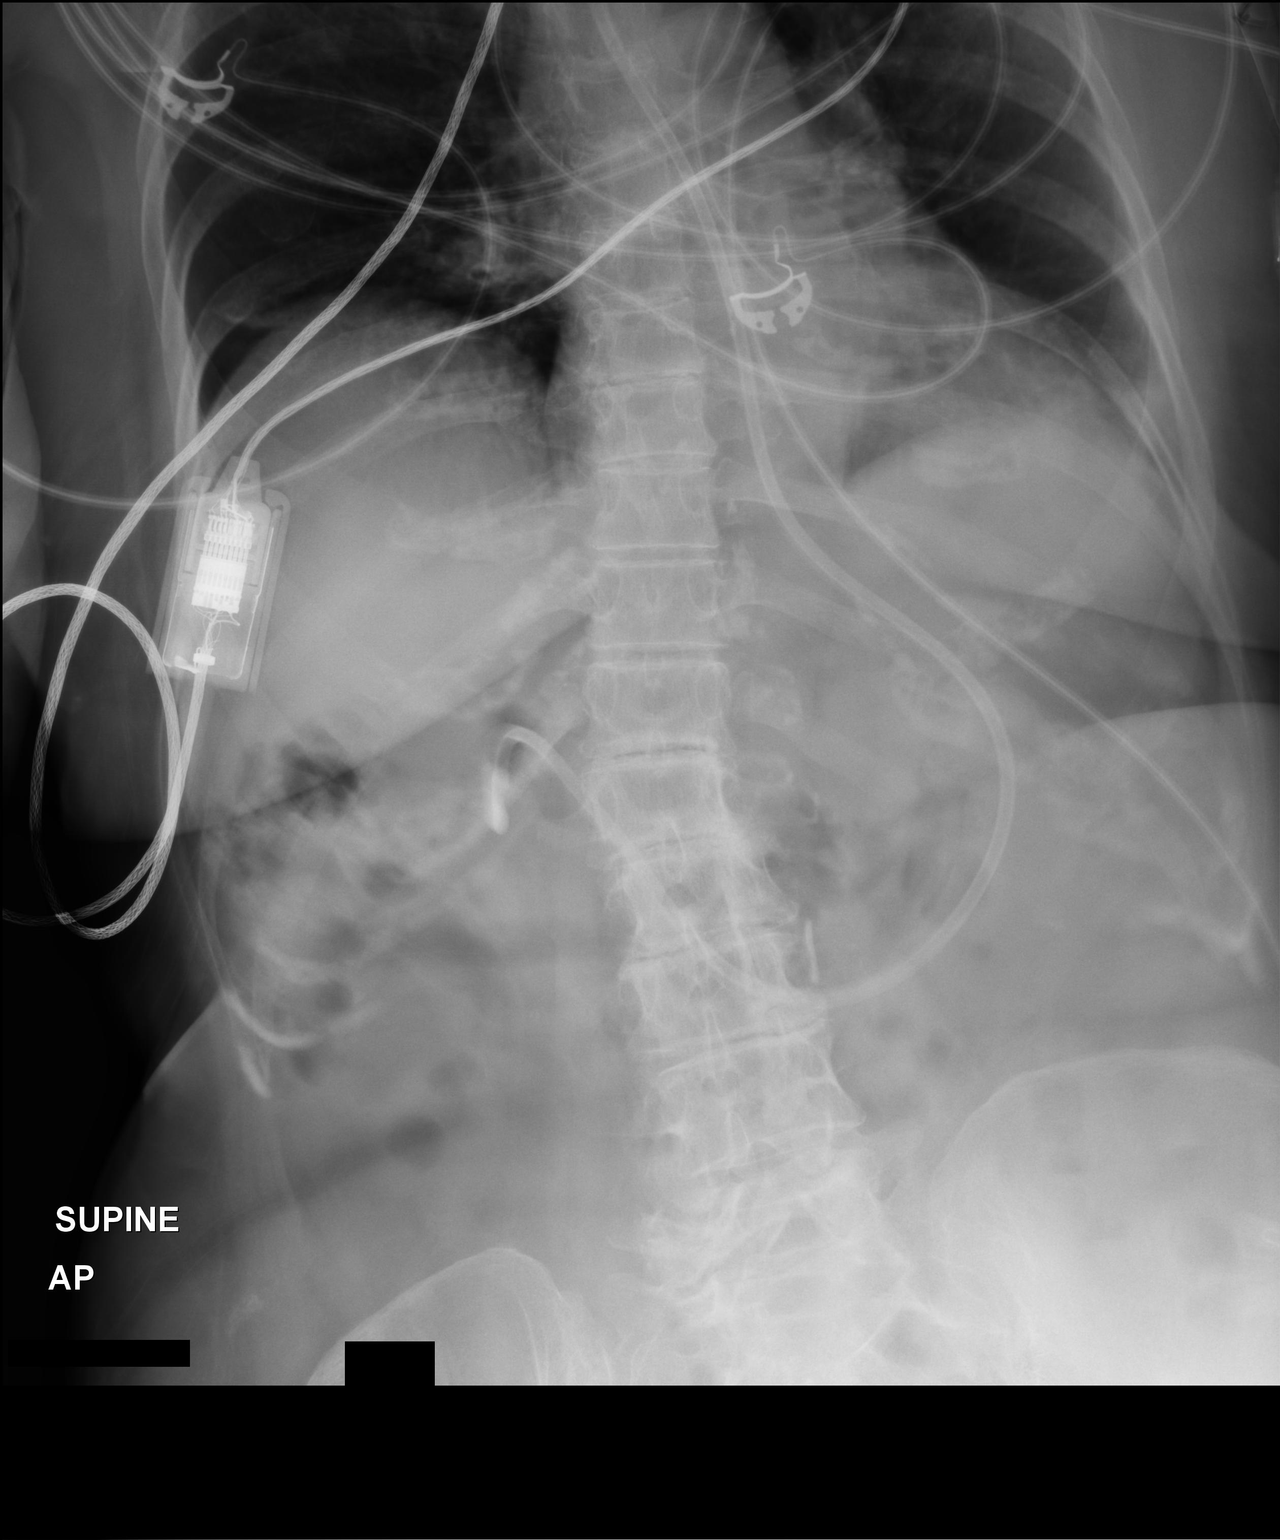

[1 of 1 positions shown; findings below may reference images not displayed]

FINDINGS: Feeding tube has been placed with the tip in the descending portion
of the duodenum. Nonobstructive bowel gas pattern.
IMPRESSION: Feeding tube tip in the second portion of the duodenum.
